# Patient Record
Sex: Male | Born: 1945 | Race: White | Hispanic: No | Marital: Married
Health system: Midwestern US, Community
[De-identification: ages and names within clinical notes are randomized; demographics above are authoritative.]

## PROBLEM LIST (undated history)

## (undated) DIAGNOSIS — M75111 Incomplete rotator cuff tear or rupture of right shoulder, not specified as traumatic: Secondary | ICD-10-CM

---

## 2006-09-28 ENCOUNTER — Encounter: Admission: RE | Admit: 2006-09-28 | Discharge: 2006-09-28 | Payer: Self-pay | Admitting: Family Medicine

## 2008-05-19 ENCOUNTER — Inpatient Hospital Stay (HOSPITAL_COMMUNITY): Admission: RE | Admit: 2008-05-19 | Discharge: 2008-05-22 | Payer: Self-pay | Admitting: Orthopedic Surgery

## 2008-10-13 ENCOUNTER — Ambulatory Visit (HOSPITAL_BASED_OUTPATIENT_CLINIC_OR_DEPARTMENT_OTHER): Admission: RE | Admit: 2008-10-13 | Discharge: 2008-10-13 | Payer: Self-pay | Admitting: Orthopedic Surgery

## 2009-03-24 ENCOUNTER — Encounter: Admission: RE | Admit: 2009-03-24 | Discharge: 2009-03-24 | Payer: Self-pay | Admitting: Family Medicine

## 2009-09-25 IMAGING — CR DG CHEST 2V
2 series · 2 of 2 positions shown · non-contrast
Comparison: None

CLINICAL DATA: Preoperative respiratory exam for hip replacement.
Hypertension.

CHEST - 2 VIEW

[view not recorded (1 of 2)]
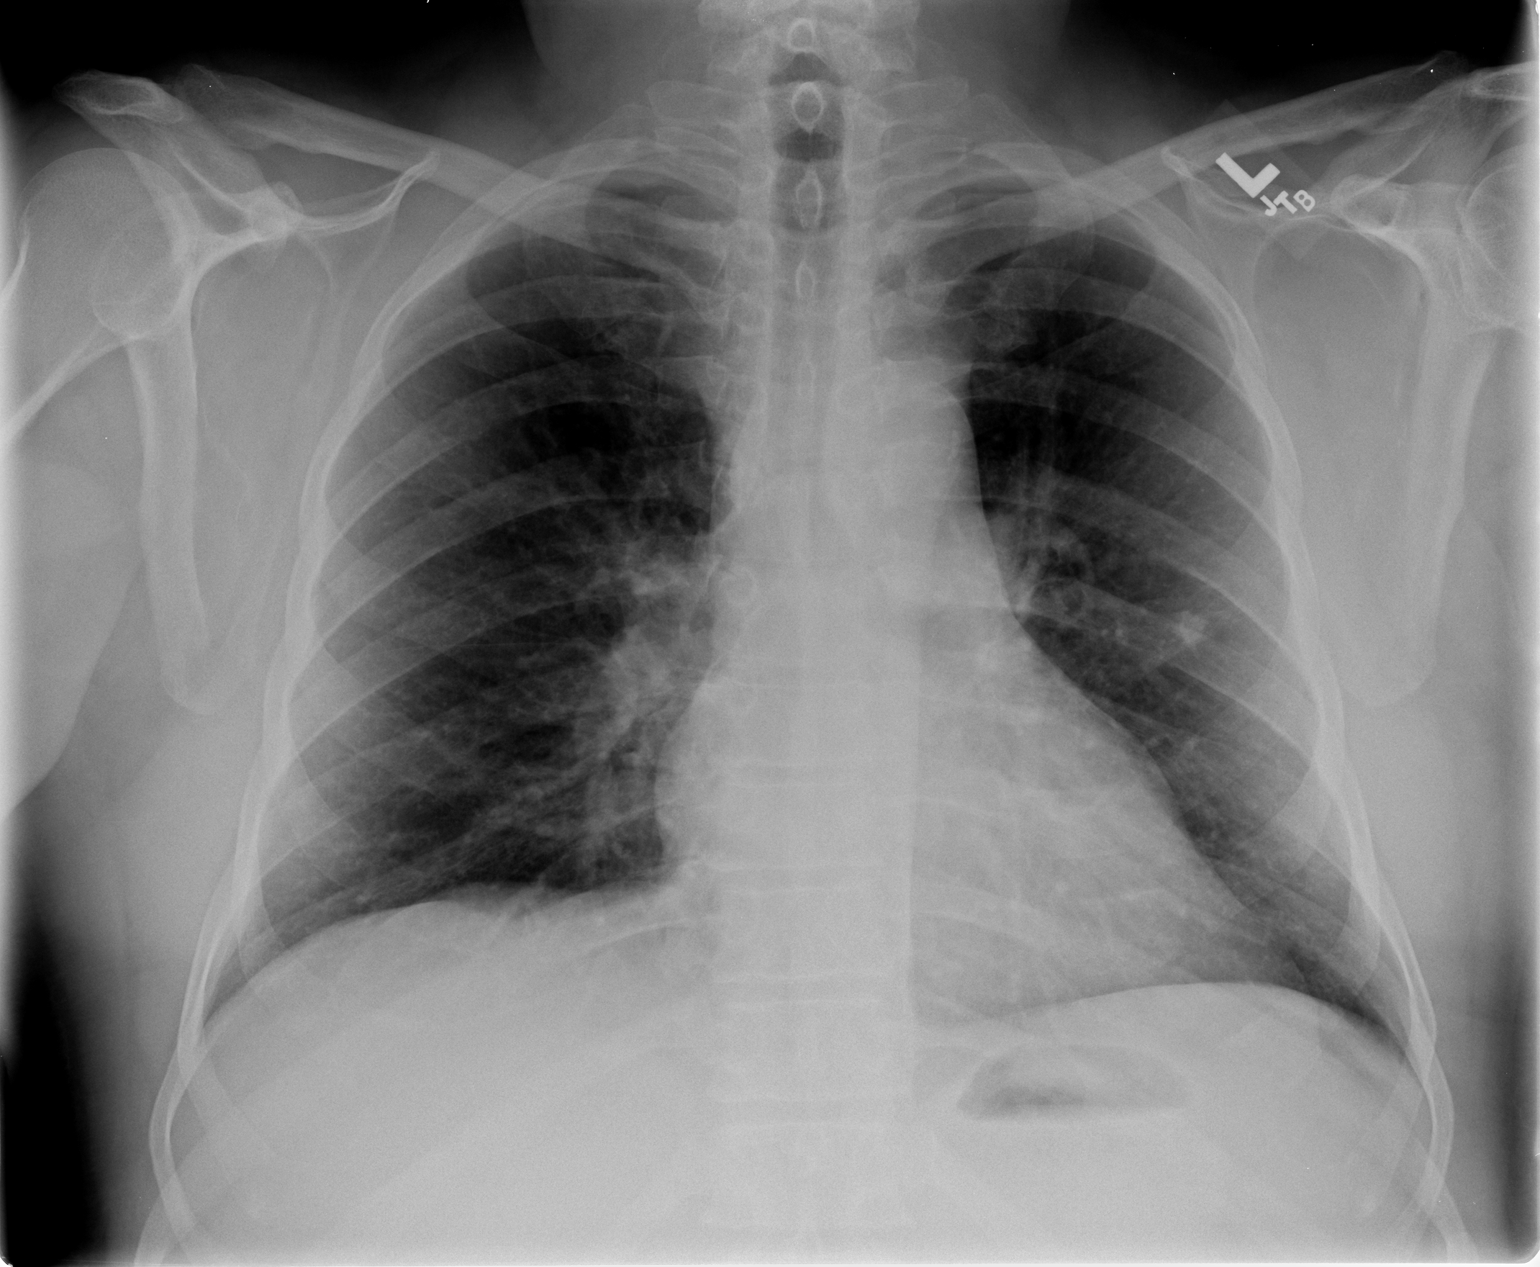

[view not recorded (2 of 2)]
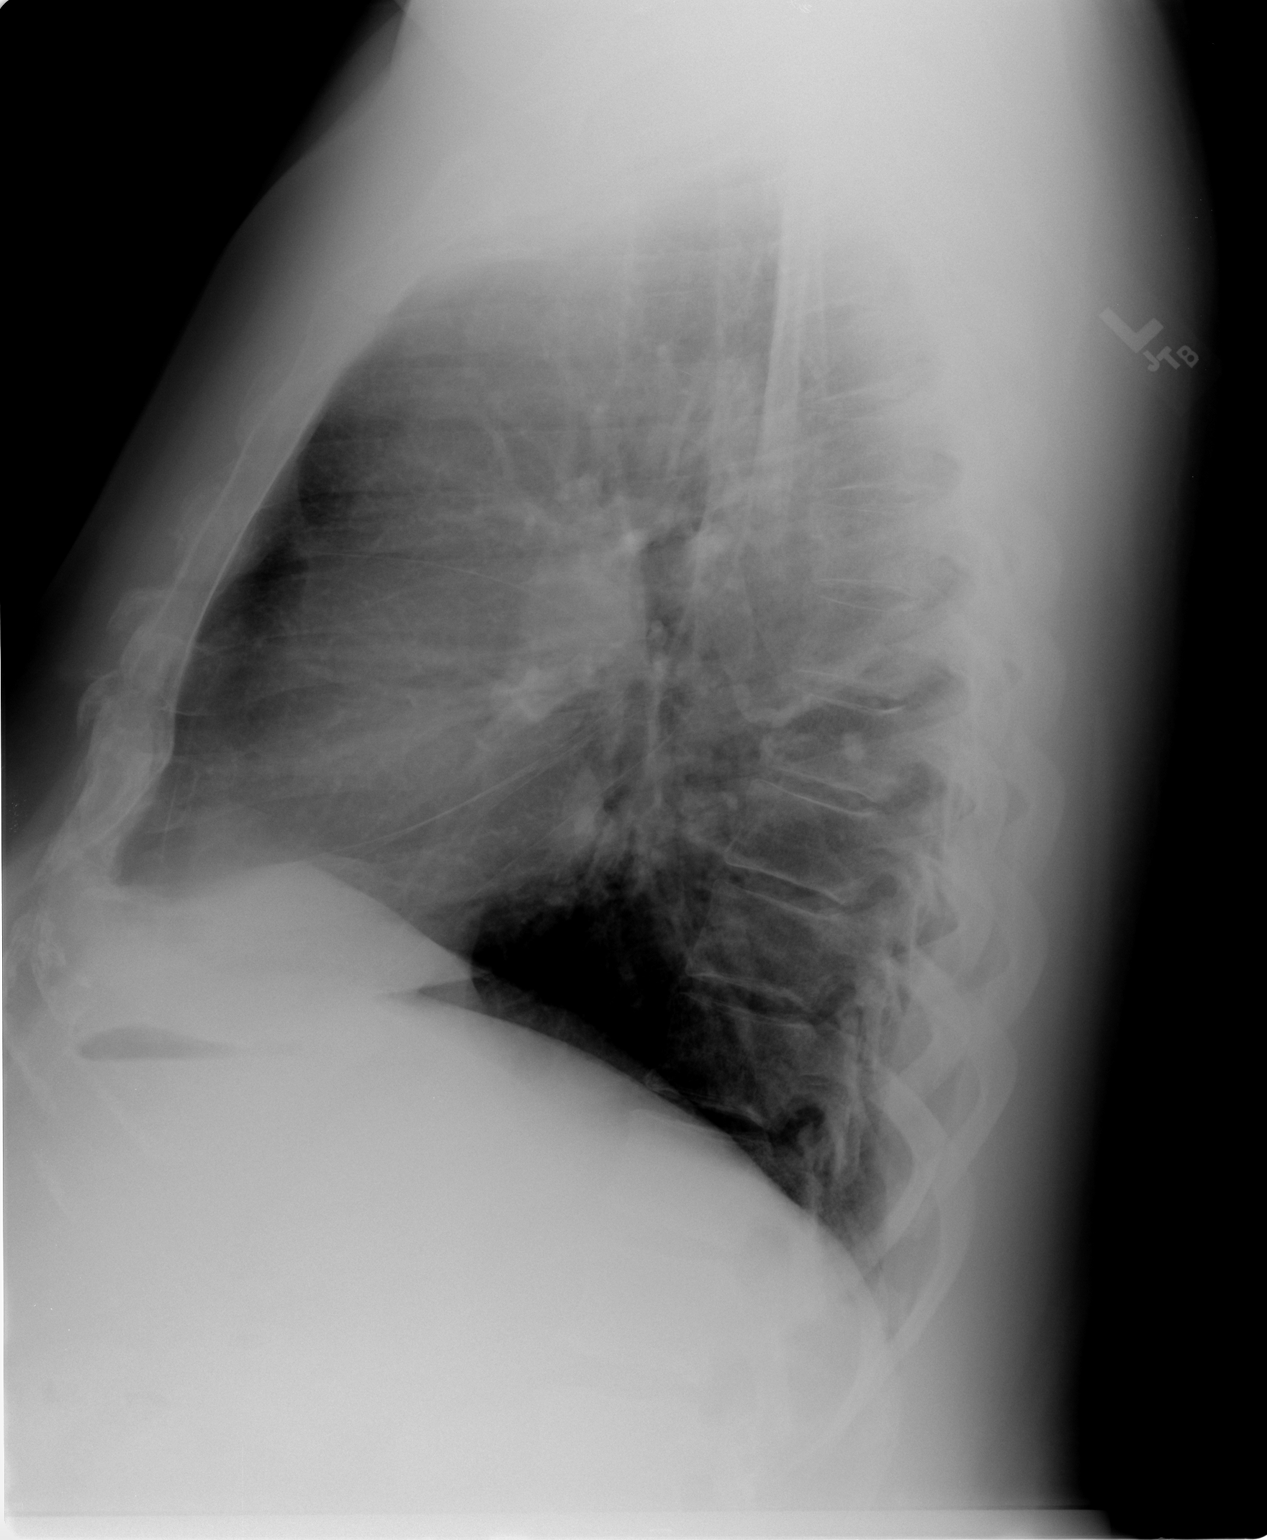

[2 of 2 positions shown; findings below may reference images not displayed]

FINDINGS: Heart size is normal.  Mediastinum is unremarkable.
There is a calcified granuloma in the superior segment of the left
lower lobe.  There are a few calcified hilar and mediastinal nodes.
No evidence of active infiltrate, mass, effusion or collapse.  Bony
structures unremarkable.
IMPRESSION: No active disease.  Old granulomatous infection.

## 2010-08-24 LAB — CBC W/O DIFF
HCT: 44.7 % (ref 41.1–50.3)
HGB: 15.4 g/dL (ref 13.2–17.1)
MCH: 29.7 PG (ref 26.1–32.9)
MCHC: 34.5 g/dL (ref 31.4–35.0)
MCV: 86.1 FL (ref 79.6–97.8)
MPV: 10.6 FL — ABNORMAL LOW (ref 10.8–14.1)
PLATELET: 190 10*3/uL (ref 150–450)
RBC: 5.19 M/uL (ref 4.23–5.67)
RDW: 13 % (ref 11.9–14.6)
WBC: 9 10*3/uL (ref 4.3–11.1)

## 2010-08-24 LAB — METABOLIC PANEL, BASIC
Anion gap: 9 mmol/L (ref 7–16)
BUN: 15 MG/DL (ref 8–23)
CO2: 30 MMOL/L (ref 23–32)
Calcium: 8.5 MG/DL (ref 8.3–10.4)
Chloride: 105 MMOL/L (ref 98–107)
Creatinine: 1.3 MG/DL (ref 0.8–1.5)
GFR est AA: >60 mL/min/{1.73_m2} (ref >60)
GFR est non-AA: 59 mL/min/{1.73_m2} — ABNORMAL LOW (ref >60)
Glucose: 129 MG/DL — ABNORMAL HIGH (ref 65–100)
Potassium: 3.8 MMOL/L (ref 3.5–5.1)
Sodium: 144 MMOL/L (ref 136–145)

## 2010-08-24 LAB — PTT: aPTT: 26.6 s (ref 25.3–32.9)

## 2010-08-24 LAB — URINALYSIS W/O MICRO
Bilirubin: NEGATIVE
Blood: NEGATIVE
Glucose: NEGATIVE MG/DL
Ketone: NEGATIVE MG/DL
Leukocyte Esterase: NEGATIVE
Nitrites: NEGATIVE
Protein: NEGATIVE MG/DL
Specific gravity: >1.03 — ABNORMAL HIGH (ref 1.001–1.023)
Urobilinogen: 0.2 EU/DL (ref 0.2–1.0)
pH (UA): 5.5 (ref 5.0–9.0)

## 2010-08-24 LAB — PROTHROMBIN TIME + INR
INR: 1 (ref 0.9–1.2)
Prothrombin time: 10.3 s (ref 8.6–12.2)

## 2010-08-24 NOTE — Other (Signed)
Pre-assessment visit completed. Name, DOB, and procedure verified with patient.  Patient verbalizes understanding of instructions in Guide to Surgery handout given.  Patient is comfortable with anesthesia evaluation on DOS and declines a visit with anesthesia today.     Patient instructed to remain NPO after midnight, and understands instructions given regarding which medications to take DOS with a small sip of water.   Patient instructed on where to arrive DOS (3rd floor) and understands preop instructions including use of Hibiclens skin cleanser. Nasal swab and bactroban teaching complete.    All ordered labs completed. Results within anesthesia guidelines. No EKG required per protocol.

## 2010-08-24 NOTE — Progress Notes (Signed)
[facility]           [address] [place], [place] [address]  [phone]    Prehab Plan of Treatment and Evaluation Summary     Consult Date: 08/24/2010  Referring Physician: Claris Gower, MD  Medical Diagnosis: OA  Treatment Diagnosis: Left hip pain and weakness in joint, pelvic region and thigh  Phone Number: [phone] (home)   Date of Surgery: 09/21/10  Home Situation:   Home Situation  Home Environment: Private residence  # Steps to Enter: 5   Rails to Enter: No  One/Two Story Residence: One Technical sales engineer Systems: Spouse   Patient Expects to be Discharged to:: Private residence  Current DME Used/Available at Home: Environmental consultant, rolling;Cane, straight;Commode, bedside  Tub or Shower Type: Shower  Functional limitations prior to injury or illness:    Bathing/Showering:   [x]  (0) No Problems with this activity  []  (1) Pain level increases with this activity  []  (1) Assistive Device with this activity  []  (1) Extra Time needed with this activity  []  (1) Assistance from another person  []  (5) Only able to do sponge bath Ambulation:  []  (0) No Problems with this activity  []  (1) Assistive Device with this activity  [x]  (1) Unable to go up and down stairs  []  (1) Able to walk short distances only  []  (1) Hold onto Furniture/countertops  []  (1) Assistance from another person  []  (5) Transfers Only/No walking     Dressing:  []  (0) No Problems with this activity  []  (1) Pain level increases with this activity  []  (1) Assistive Device with this activity  [x]  (1) Extra Time needed with this activity  [x]  (1) Assistance from another person  []  (5) Someone else dresses my lower body Household Activities:  []  (1) Routine house and yard work  [x]  (2) All but heavy work   []  (3) Housework only  []  (4) Light housework   []  (5) None     Additional Comments: motivated  Pain Screen  Pain Scale 1: Numeric (0 - 10)  Pain Intensity 1: 9  Pain Location 1: Hip   Pain Orientation 1: Left;Lateral;Anterior  Pain Description 1: Radiating;Intermittent  Recent Pain History (0=no pain, 10=worst)  Least pain (over the previous 24 Hours): 7  Most pain (over the previous 24 Hours): 9  Statement of patient's goals: "fix the hip"  OBJECTIVE:  Gross Assessment  AROM: Within functional limits (R LE)  Strength: Within functional limits (R LE)  Sensation: Intact (R LE)   Distance (ft): 1000 Feet (ft)Ambulation - Level of Assistance: Completely independent      Gait Abnormalities: Antalgic  Surface:level tile                      LLE AROM  L Hip Flexion: 120   L Hip ABduction: 30    LLE Strength  L Hip Flexion: 4-  L Hip ABduction: 4-  L Knee Extension: 4    Prehab Problem: Decreased strength and increased pain in left lower extremity(s).  Prehab Goal:  Pt. will increase strength via home exercise program to minimize functional deficits in regards to upcoming surgery using education, demonstration and by providing an informational handout including a home exercise program.  Prehab Plan:  This is a one time physical therapy visit and the patient will be discharged from physical therapy after this visit.  Time Frame: Two to three weeks  Treatment Plan  Effective Dates: 08/24/2010 to 08/24/2010  Rehabilitation potential for stated goals: Good    PT Patient Time In/Time Out  Time In: 1200  Time Out: 1245  DENISE LEE FLASPOEHLER, PT

## 2010-08-25 LAB — MSSA/MRSA SC BY PCR, NASAL SWAB

## 2010-08-25 NOTE — Other (Signed)
Patient notified of negative MSSA/MRSA nasal swab. Verbalized understanding.

## 2010-09-10 NOTE — Other (Signed)
Opened chart to perform audit

## 2010-09-20 NOTE — Other (Signed)
Open chart to review prior to surgery to ensure patient data is complete.

## 2010-09-20 NOTE — H&P (Signed)
[facility]  History and Physical Exam    Patient ID:  Brian Buckley  [id number]    64 y.o.  [birth year]    Today: September 20, 2010    Vitals Signs: Reviewed as noted in medical record.    Allergies: No Known Allergies    CC: Revision left Hip.    HPI:  Pt complains of left hip pain and with difficulty ambulating. Pain with rest at night. Difficulty getting to socks and shoes.     Relevant PMH: Past Medical History   Diagnosis Date   ??? Hypertension    ??? Arthritis    ??? Nausea & vomiting    ??? Unspecified sleep apnea      does not use CPAP         Objective:                    HEENT: NC/AT                   Lungs:  clear                   Heart:   rrr                   Abdomen: soft                   Extremities:  Pain with rom of the left hip      Assessment:  Complication of the left hip replacement.    Plan:  Proceed with scheduled revision of the left hip arthroplasty    Signed By: Claris Gower, MD  September 20, 2010

## 2010-09-21 ENCOUNTER — Inpatient Hospital Stay
Admit: 2010-09-21 | Discharge: 2010-09-23 | Disposition: A | Discharge status: Home Health | Source: Ambulatory Visit | Attending: Orthopaedic Surgery | Admitting: Orthopaedic Surgery

## 2010-09-21 DIAGNOSIS — Z96649 Presence of unspecified artificial hip joint: Secondary | ICD-10-CM

## 2010-09-21 LAB — TYPE & SCREEN
ABO/Rh(D): A NEG
Antibody screen: NEGATIVE

## 2010-09-21 LAB — GLUCOSE, POC: Glucose (POC): 102 mg/dL — ABNORMAL HIGH (ref 65–100)

## 2010-09-21 LAB — HEMOGLOBIN: HGB: 13.7 g/dL (ref 13.2–17.1)

## 2010-09-21 LAB — TYPE AND SCREEN
ABO/Rh: A NEG
Antibody Screen: NEGATIVE

## 2010-09-21 MED ORDER — SALINE PERIPHERAL FLUSH Q8H
Freq: Three times a day (TID) | INTRAMUSCULAR | Status: DC
Start: 2010-09-21 — End: 2010-09-23
  Administered 2010-09-21 – 2010-09-23 (×7)

## 2010-09-21 MED ORDER — SALINE PERIPHERAL FLUSH PRN
INTRAMUSCULAR | Status: DC | PRN
Start: 2010-09-21 — End: 2010-09-23

## 2010-09-21 MED ORDER — LACTATED RINGERS IV
INTRAVENOUS | Status: DC
Start: 2010-09-21 — End: 2010-09-21

## 2010-09-21 MED ADMIN — docusate sodium (COLACE) capsule 100 mg: ORAL | @ 23:00:00 | NDC 62584068311

## 2010-09-21 MED ADMIN — ceFAZolin (ANCEF) 2g IVPB: INTRAVENOUS | @ 17:00:00 | NDC 99990004319

## 2010-09-21 MED ADMIN — famotidine (PEPCID) tablet 20 mg: ORAL | @ 15:00:00 | NDC 68084017211

## 2010-09-21 MED ADMIN — celecoxib (CELEBREX) capsule 200 mg: ORAL | @ 23:00:00 | NDC 00025152551

## 2010-09-21 MED ADMIN — lidocaine (XYLOCAINE) 10 mg/mL (1 %) injection 0.1 mL: SUBCUTANEOUS | @ 15:00:00 | NDC 00409427601

## 2010-09-21 MED ADMIN — metoprolol-XL (TOPROL-XL) tablet 100 mg: ORAL | @ 16:00:00 | NDC 00186109239

## 2010-09-21 MED ADMIN — oxyCODONE (ROXICODONE) tablet 10 mg: ORAL | @ 21:00:00 | NDC 00406055262

## 2010-09-21 MED ADMIN — ondansetron (ZOFRAN ODT) tablet 8 mg: ORAL | @ 21:00:00 | NDC 71930001852

## 2010-09-21 MED ADMIN — lactated ringers infusion: INTRAVENOUS | @ 15:00:00 | NDC 00409795309

## 2010-09-21 MED ADMIN — metoclopramide (REGLAN) injection 10 mg: INTRAVENOUS | NDC 00703450204

## 2010-09-21 MED FILL — FENTANYL CITRATE (PF) 50 MCG/ML IJ SOLN: 50 mcg/mL | INTRAMUSCULAR | Qty: 5

## 2010-09-21 MED FILL — CELEBREX 200 MG CAPSULE: 200 mg | ORAL | Qty: 1

## 2010-09-21 MED FILL — NEOMYCIN-POLYMYXIN B GU 40 MG-200,000 UNIT/ML IRRIGATION SOLN: 40 mg-200,000 unit/mL | Qty: 3

## 2010-09-21 MED FILL — KETOROLAC TROMETHAMINE 30 MG/ML INJECTION: 30 mg/mL (1 mL) | INTRAMUSCULAR | Qty: 1

## 2010-09-21 MED FILL — OXYCODONE 5 MG TAB: 5 mg | ORAL | Qty: 2

## 2010-09-21 MED FILL — METOPROLOL SUCCINATE SR 100 MG 24 HR TAB: 100 mg | ORAL | Qty: 1

## 2010-09-21 MED FILL — DEXTROSE 5%-1/2 NORMAL SALINE IV: INTRAVENOUS | Qty: 1000

## 2010-09-21 MED FILL — DOCUSATE SODIUM 100 MG CAP: 100 mg | ORAL | Qty: 1

## 2010-09-21 MED FILL — MORPHINE 10 MG/ML IJ SOLN: 10 mg/mL | INTRAMUSCULAR | Qty: 1

## 2010-09-21 MED FILL — EPINEPHRINE (PF) 1 MG/ML INJECTION: 1 mg/mL ( mL) | INTRAMUSCULAR | Qty: 1

## 2010-09-21 MED FILL — MIDAZOLAM 5 MG/ML IJ SOLN: 5 mg/mL | INTRAMUSCULAR | Qty: 1

## 2010-09-21 MED FILL — NAROPIN (PF) 2 MG/ML (0.2 %) INJECTION SOLUTION: 2 mg/mL (0. %) | INTRAMUSCULAR | Qty: 60

## 2010-09-21 MED FILL — METOCLOPRAMIDE 5 MG/ML IJ SOLN: 5 mg/mL | INTRAMUSCULAR | Qty: 2

## 2010-09-21 NOTE — Progress Notes (Signed)
BP 148/83   Pulse 64   Temp 97.7 ??F (36.5 ??C)   Resp 16   Ht 5' 10.5" (1.791 m)   Wt 263 lb (119.296 kg)   BMI 37.20 kg/m2   SpO2 99%.  Alert and oriented. Satisfactory pain control.  Tolerated procedure well without apparent problems.  Transfer to floor.  Follow up with surgeon.

## 2010-09-21 NOTE — Progress Notes (Signed)
Pt left on O2 has OSA but does not wear CPAP.

## 2010-09-21 NOTE — Other (Signed)
TRANSFER - OUT REPORT:    Verbal report given to [facility] RN on Brian Buckley  being transferred to [facility] for routine progression of care       Report consisted of patient???s Situation, Background, Assessment and   Recommendations(SBAR).     Information from the following report(s) SBAR, Kardex, [facility] and Recent Results was reviewed with the receiving nurse.    Opportunity for questions and clarification was provided.      Dr. Beryle Quant was called to report pt not taking beta blocker this am, he ordered Toprol XL 100mg  once.  Shelly notified and medication will be administered.  Shelly also notified that pt needs to sign paperwork from pt relations before any sedation.

## 2010-09-21 NOTE — Progress Notes (Signed)
Pt resting well up in bed tolerating dinner well.  No c/os and no changes in assessment.  Pt wiggles toes well bilat and has strong push/ pulls.  Wife at bedside.

## 2010-09-21 NOTE — Progress Notes (Signed)
Pt lying semi fowlers position in bed.Left hip dressing intact with large amount of sanguinous breakthrough drainage.Reinforced bandage with 4x4's, ABD pads and secured with tape. Neurovascular status WDL. Denies further needs at present. Instructed to call for any needs and for assistance before getting out of bed.  Side rails up x3. Call light within reach.Bed in low and locked position. Family member at bedside.

## 2010-09-21 NOTE — Progress Notes (Signed)
Problem: Mobility Impaired (Adult and Pediatric)  Goal: *Acute Goals and Plan of Care (Insert Text)  SHORT TERM GOALS:  (1.)Brian Buckley will move from supine to sit and sit to supine in bed with CONTACT GUARD ASSIST within 1-3 days.   (2.)Brian Buckley will transfer from bed to chair and chair to bed with CONTACT GUARD ASSIST using the least restrictive device within 1-3 days.   (3.)Brian Buckley will ambulate with CONTACT GUARD ASSIST for 150 feet with the least restrictive device within 1-3 days.     LONG TERM GOALS:  (1.)Brian Buckley will move from supine to sit and sit to supine in bed with SUPERVISION within 4-6 days.   (2.)Brian Buckley will transfer from bed to chair and chair to bed with SUPERVISION using the least restrictive device within 4-6 days.   (3.)Brian Buckley will ambulate with SUPERVISION for 300 feet with the least restrictive device within 4-6 days.   (4.)Brian Buckley will ambulate up/down 3 steps with bilateral railing with STAND BY ASSIST with no device within 4-6 days.  (5.)Brian Buckley will state/observe THA precautions with 0 verbal cues within 4-6 days.  ________________________________________________________________________________________________    ACUTE PHYSICAL THERAPY JOINT CAMP THA ASSESSMENT NOTE  [X]    Initial/Completed Assessment                [ ]    7th Visit        [ ]    Discharge  NAME/AGE/GENDER: Brian Buckley is a 64 y.o. 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 Hypertension     ??? Arthritis     ??? Nausea & vomiting     ??? Unspecified sleep apnea         does not use CPAP      Past Surgical History   Procedure Date   ??? Total hip arthroplasty 1995, 2009       right, left   ??? Hx orthopaedic         right big toe fusion   ??? Hx other surgical 1965       pilonial cyst       Patient Active Problem List   Diagnoses Code   ??? 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  OBJECTIVE:    Pain Intensity 1: 6Pain Location 1: Hip    Gross Assessment: Yes  Gross Assessment  AROM: Within functional limits (except L LE, s/p THA)  Strength: Within functional limits (except L LE, s/p THA)    Bed Mobility  Supine to Sit: Additional time;Assist X1;Minimal assistance;CGA  Sit to Supine: Assist X1;Additional time;Minimum assistance;CGA  Scooting: Additional time    Transfers  Sit to Stand: Assist X2;Minimum assistance;Verbal cues  Stand to Sit: Assist X2;Minimum assistance;Verbal cues    Balance  Sitting: Intact  Standing: Intact;With support       Posture  Posture (WDL): Within defined limits    Left Side Weight Bearing: As tolerated  Gait Training  Assistive Device: Walker, rolling  Ambulation - Level of Assistance: Minimal assistance (of 1 to 2)  Distance (ft): 4 Feet (ft)  Speed/Cadence: Delayed  Step Length: Left shortened;Right shortened  Stance: Left decreased  Gait Abnormalities: Antalgic;Step to gait   Surface:level tile     Braces/Orthotics: none      Treatment Times:              Initial Evaluation: 20 minutes              Therapeutic Exercise:               Gait Training:               Therapeutic Activity:                Neuromuscular Re-education:      Education:  [ ]    Home Exercises    [ ]    Going Home Video             [X]    Hip Precautions    [ ]    Fall Precautions     [ ]    Knee/Hip Prosthesis Review  [X]    Walker Management/Safety   [ ]    Other:     Safety:    After treatment precautions: [X]    Bed            [ ]    Rails Up        [ ]    Chair     [X]    Essentials within Reach    [ ]    Restraint in place      [X]    Caregiver present  [ ]    RN notified              [ ]    Bed Alarm/Tab Alert applied  ASSESSMENT:  Patient would benefit from skilled Physical Therapy intervention to maximize independence with functional mobility.   PROBLEM LIST:  1. Decreased Independence with Bed Mobility   2. Decreased Independence with Transfers   3. Decreased Independence with Ambulation   4. Decreased Independence with Stair Climbing   5. Decreased Hip ROM and Strength     INTERVENTIONS PLANNED:  1. Bed Mobility Training   2. Transfer Training   3. Gait Training   4. Stair Training   5. Therapeutic Exercises   6. Modalities for Pain     PLAN: Continue to follow patient twice daily for  until goals met to address goals per initial evaluation.    REHABILITATION POTENTIAL FOR STATED GOALS:  Good             ?? Patient???s response to todays session was: tolerated well with no complications.   ?? Compliance with program/exercises: compliant all of the time.   ?? Recommended level of rehabilitation at time of discharge (pending progress): Home Health   ?? Other Comments/Recommendations/DME: has a RW, cand and a BSC    Plan/Intent for next treatment: Physical therapy for bed mobility, transfers, gait training, strength/ROM exercises, modalities for pain, and patient education.   PT Patient Time In/Time Out  Time In: 1550  Time Out: 1611  MICHELLE N HOLMES, PT

## 2010-09-21 NOTE — Progress Notes (Signed)
TRANSFER - IN REPORT:    Verbal report received from Carvel Getting, rn(name) on Thai Burgueno  being received from [employer]) for routine progression of care      Report consisted of patient???s Situation, Background, Assessment and   Recommendations(SBAR).     Information from the following report(s) Kardex, Procedure Summary, Intake/Output, MAR and Recent Results was reviewed with the receiving nurse.    Opportunity for questions and clarification was provided.      Assessment completed upon patient???s arrival to unit and care assumed.     Pt and family oriented to room, bed controls and nursing call light.  Handout given to pt r/t Lovenox and pain meds while in hospital.  Encouraged i/s and po fluids.    2

## 2010-09-21 NOTE — Other (Signed)
Betadine lavage:  17.5cc of betadine lot # [id number] , exp. Date 04/2013 ,  in 500cc of .9NS Lot # [id number] , exp. Date :24 Mar 2013

## 2010-09-21 NOTE — Other (Signed)
TRANSFER - OUT REPORT:    Verbal report given to  [facility] RN  on Brian Buckley  being transferred to   [facility]   for routine progression of care       Report consisted of patient???s Situation, Background, Assessment and   Recommendations(SBAR).     Information from the following report(s) SBAR, OR Summary, Procedure Summary, Intake/Output and MAR was reviewed with the receiving nurse.    Opportunity for questions and clarification was provided.

## 2010-09-21 NOTE — Brief Op Note (Signed)
BRIEF OPERATIVE NOTE    Date of Procedure: 09/21/2010   Preoperative Diagnosis: OA  Postoperative Diagnosis: OA    Procedure:  HIP ARTHROPLASTY TOTAL REVISION - REMOVAL DEPUY  IMPLANT /  REVISION TOTAL  HIP WITH DEPUY    Surgeon: Claris Gower, MD  Assistant(s): Gwen Pounds and Electa Sniff   Anesthesia: Spinal   Estimated Blood Loss: 400  Specimens: * No specimens in log *   Findings: See full operative note.  Complications: none  Implants:   Implant Name Type Inv. Item Serial No. Manufacturer Lot No. LRB No. Used Action   SCR BNE CANC PINN 6.5X30MM SS - [id number]  SCR BNE CANC PINN 6.5X30MM SS [id number] [employer] [id number] Left 1 Implanted   SCR ACET CANC PINN 6.5X25MM SS - [id number]  SCR ACET CANC PINN 6.5X25MM SS [id number] [employer] [id number] Left 1 Implanted   CUP ACET MH PINN GRIPTION - [id number]  CUP ACET MH PINN GRIPTION [id number] [employer] [id number] Left 1 Implanted   S-ROM M-SPEC FEMORAL HEAD   [id number] [employer] [id number] Left 1 Implanted   ACETABULAR LINER     [id number] [employer] [id number] Left 1 Implanted

## 2010-09-22 LAB — HEMOGLOBIN: HGB: 12 g/dL — ABNORMAL LOW (ref 13.2–17.1)

## 2010-09-22 MED ADMIN — ondansetron (ZOFRAN ODT) tablet 8 mg: ORAL | @ 02:00:00 | NDC 00781523906

## 2010-09-22 MED ADMIN — oxyCODONE (ROXICODONE) tablet 10 mg: ORAL | @ 14:00:00 | NDC 00406055262

## 2010-09-22 MED ADMIN — oxyCODONE (ROXICODONE) tablet 10 mg: ORAL | @ 09:00:00 | NDC 00406055262

## 2010-09-22 MED ADMIN — ondansetron (ZOFRAN ODT) tablet 8 mg: ORAL | @ 19:00:00 | NDC 71930001852

## 2010-09-22 MED ADMIN — ceFAZolin (ANCEF) 2g IVPB: INTRAVENOUS | @ 17:00:00 | NDC 99990004319

## 2010-09-22 MED ADMIN — tuberculin injection 5 Units: INTRADERMAL | @ 14:00:00 | NDC 99990003564

## 2010-09-22 MED ADMIN — dextrose 5 % - 0.45% NaCl infusion: INTRAVENOUS | @ 02:00:00 | NDC 00409792609

## 2010-09-22 MED ADMIN — oxyCODONE (ROXICODONE) tablet 10 mg: ORAL | @ 01:00:00 | NDC 00406055262

## 2010-09-22 MED ADMIN — ceFAZolin (ANCEF) 2g IVPB: INTRAVENOUS | @ 09:00:00 | NDC 99990004319

## 2010-09-22 MED ADMIN — celecoxib (CELEBREX) capsule 200 mg: ORAL | @ 22:00:00 | NDC 00025152551

## 2010-09-22 MED ADMIN — dextrose 5 % - 0.45% NaCl infusion: INTRAVENOUS | @ 22:00:00 | NDC 00409792609

## 2010-09-22 MED ADMIN — aspirin delayed-release tablet 81 mg: ORAL | @ 22:00:00 | NDC 00904770418

## 2010-09-22 MED ADMIN — ceFAZolin (ANCEF) 2g IVPB: INTRAVENOUS | @ 01:00:00 | NDC 99990004319

## 2010-09-22 MED ADMIN — docusate sodium (COLACE) capsule 100 mg: ORAL | @ 14:00:00 | NDC 62584068311

## 2010-09-22 MED ADMIN — docusate sodium (COLACE) capsule 100 mg: ORAL | @ 22:00:00 | NDC 62584068311

## 2010-09-22 MED ADMIN — enoxaparin (LOVENOX) injection 40 mg: SUBCUTANEOUS | @ 14:00:00 | NDC 00075062040

## 2010-09-22 MED ADMIN — aspirin delayed-release tablet 81 mg: ORAL | @ 14:00:00 | NDC 00904770418

## 2010-09-22 MED ADMIN — celecoxib (CELEBREX) capsule 200 mg: ORAL | @ 14:00:00 | NDC 00025152551

## 2010-09-22 MED ADMIN — ondansetron (ZOFRAN ODT) tablet 8 mg: ORAL | @ 10:00:00 | NDC 00781523906

## 2010-09-22 MED ADMIN — metoprolol-XL (TOPROL-XL) tablet 100 mg: ORAL | @ 18:00:00 | NDC 58177036809

## 2010-09-22 MED ADMIN — metoprolol-XL (TOPROL-XL) tablet 100 mg: ORAL | @ 17:00:00 | NDC 00186109239

## 2010-09-22 MED ADMIN — oxyCODONE (ROXICODONE) tablet 10 mg: ORAL | @ 17:00:00 | NDC 00406055262

## 2010-09-22 MED ADMIN — aspirin delayed-release tablet 81 mg: ORAL | @ 02:00:00 | NDC 00904770418

## 2010-09-22 MED ADMIN — oxyCODONE (ROXICODONE) tablet 10 mg: ORAL | NDC 00406055262

## 2010-09-22 MED FILL — OXYCODONE 5 MG TAB: 5 mg | ORAL | Qty: 2

## 2010-09-22 MED FILL — CELEBREX 200 MG CAPSULE: 200 mg | ORAL | Qty: 1

## 2010-09-22 MED FILL — METOPROLOL SUCCINATE SR 100 MG 24 HR TAB: 100 mg | ORAL | Qty: 1

## 2010-09-22 MED FILL — LOVENOX 40 MG/0.4 ML SUBCUTANEOUS SYRINGE: 40 mg/0.4 mL | SUBCUTANEOUS | Qty: 0.4

## 2010-09-22 MED FILL — ASPIRIN 81 MG TAB, DELAYED RELEASE: 81 mg | ORAL | Qty: 1

## 2010-09-22 MED FILL — CEFAZOLIN 2 G IN 100 ML 0.9% NS: INTRAVENOUS | Qty: 100

## 2010-09-22 MED FILL — TUBERCULIN PPD 5 UNIT/0.1 ML INTRADERMAL: 5 tub. unit /0.1 mL | INTRADERMAL | Qty: 0.1

## 2010-09-22 MED FILL — DOCUSATE SODIUM 100 MG CAP: 100 mg | ORAL | Qty: 1

## 2010-09-22 MED FILL — ONDANSETRON 8 MG TAB, RAPID DISSOLVE: 8 mg | ORAL | Qty: 1

## 2010-09-22 MED FILL — MARCAINE SPINAL (PF) 0.75 % (7.5 MG/ML) INJECTION SOLUTION: 0.75 % (7.5 mg/mL) | INTRAMUSCULAR | Qty: 2

## 2010-09-22 NOTE — Progress Notes (Signed)
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  OBJECTIVE:  Pt. Recalled  2/3 hip precautions.  Pain Intensity 1: 4 (during gait; 0/10 at rest)Pain Location 1: HipPain Orientation 1: LeftPain Intervention(s) 1: Medication (see  PTA Meds/MAR)              Gait Training  Assistive Device: Walker, rolling  Ambulation - Level of Assistance: CGA  Distance (ft): 215 Feet (ft)  Speed/Cadence: Delayed  Step Length: Left shortened;Right shortened  Stance: Left decreased  Gait Abnormalities: Antalgic    Bed Mobility  Supine to Sit: Minimal assistance  Sit to Supine:  (pt left up in chair)  Scooting: Additional time     Transfers  Sit to Stand: Minimum assistance  Stand to Sit: Minimum assistance     Balance  Sitting: Intact  Standing: Pull to stand;With support                     Left Hip Cold  Type: Cold/ice pack  Patient Position: Sitting      EXERCISES   AM    PM   Active Active Assist   Passive   Comments   GROUP THERAPY []  []        Ankle Pumps  15 [x]  []  []     Quad Sets  15 [x]  []  []     Gluteal Sets  15 [x]  []  []     Hip Abd/Adduction  15 [x]  []  []     Straight Leg Raises   []  []  []     Heel Slides  15 []  [x]  []     Short Arc Quads   []  []  []     Long Arc Quads  15 [x]  []  []     Chair Slides   []  []  []        []  []  []       Treatment Times:   Therapeutic Exercise: 10 Minutes   Gait Training:  15 minutes Therapeutic Activity:      Neuromuscular Re-education:    Education:  [x] Home Exercises  [] Going Home Video   [x] Hip Precautions   [] Fall Precautions  [] Knee/Hip Prosthesis Review  [x] Walker Management/Safety   [] Other:   Safety:    After treatment precautions: [] Bed            [] Rails Up        [x] Chair      [x] Essentials within Reach    [] Restraint in place      [x] Caregiver present  [] RN notified   [] Bed Alarm/Tab Alert applied    ASSESSMENT:    ?? Patient???s response to today???s session was: tolerated well with no complications.  ?? Patient is demonstrating: moderate progress towards goal(s).  ?? Compliance with program/exercises: compliant most of the time.   ?? Recommended level of rehabilitation at time of discharge (pending progress): Home Health  ?? Other Comments/Recommendations/DME: rolling walker      Plan/Intent for next treatment: Physical therapy for bed mobility, transfers, gait training, strength/ROM exercises, modalities for pain, and patient education.    Continue to follow patient twice daily for  duration of hospital stay to address goals per initial evaluation.  PT Patient Time In/Time Out  Time In: 1330  Time Out: 1400  JULIE K MCCORMICK, PT

## 2010-09-22 NOTE — Progress Notes (Signed)
Problem: Mobility Impaired (Adult and Pediatric)  Goal: *Acute Goals and Plan of Care (Insert Text)  SHORT TERM GOALS:  (1.)Brian Buckley will move from supine to sit and sit to supine in bed with CONTACT GUARD ASSIST within 1-3 days.   (2.)Brian Buckley will transfer from bed to chair and chair to bed with CONTACT GUARD ASSIST using the least restrictive device within 1-3 days.   (3.)Brian Buckley will ambulate with CONTACT GUARD ASSIST for 150 feet with the least restrictive device within 1-3 days. GOAL MET 09/22/2010    LONG TERM GOALS:  (1.)Brian Buckley will move from supine to sit and sit to supine in bed with SUPERVISION within 4-6 days.   (2.)Brian Buckley will transfer from bed to chair and chair to bed with SUPERVISION using the least restrictive device within 4-6 days.   (3.)Brian Buckley will ambulate with SUPERVISION for 300 feet with the least restrictive device within 4-6 days.   (4.)Brian Buckley will ambulate up/down 3 steps with bilateral railing with STAND BY ASSIST with no device within 4-6 days.  (5.)Brian Buckley will state/observe THA precautions with 0 verbal cues within 4-6 days.  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  OBJECTIVE:    Pain Intensity 1: 4Pain Location 1: HipPain Orientation 1: LeftPain Intervention(s) 1: Medication (see  PTA Meds/MAR)  Gait Training  Assistive Device: Walker, rolling  Ambulation - Level of Assistance: CGA  Distance (ft): 180 Feet (ft)  Speed/Cadence: Delayed  Step Length: Left shortened;Right shortened  Stance: Left decreased  Gait Abnormalities: Antalgic    Surface:level    Bed Mobility  Supine to Sit: Minimal assistance  Sit to Supine:  (left up in chair)     Transfers  Sit to Stand: Assist X1;Minimum assistance  Stand to Sit: Assist X1;Minimum assistance;Verbal cues     Balance  Sitting: Intact  Standing: Intact;With support     Left Hip Cold  Type: Cold/ice pack  Patient Position: Sitting        EXERCISES   AM    PM   Active Active Assist   Passive   Comments   GROUP THERAPY [ ]    [ ]              Ankle Pumps 15   [X]    [ ]    [ ]        Quad Sets 15   [X]    [ ]    [ ]        Gluteal Sets 15   [X]    [ ]    [ ]        Hip Abd/Adduction 15   [ ]    [X]    [ ]        Straight Leg Raises     [ ]    [ ]    [ ]        Heel Slides 15   [X]    [ ]    [ ]        Short Arc Quads     [ ]    [ ]    [ ]        Long Arc  Quads     [ ]    [ ]    [ ]        Chair Slides     [ ]    [ ]    [ ]              [ ]    [ ]    [ ]          Treatment Times:              Therapeutic Exercise:  15            Gait Training:  15            Therapeutic Activity:                Neuromuscular Re-education:    Education:  [ ]   Home Exercises   [ ]   Going Home Video           [X]   Hip Precautions              [ ]   Fall Precautions    [ ]   Knee/Hip Prosthesis Review  [X]   Walker Management/Safety   [ ]   Other:   Safety:    After treatment precautions: [ ]   Bed            [ ]   Rails Up        [X]   Chair     [X]   Essentials within Reach    [ ]   Restraint in place      [X]   Caregiver present  [ ]   RN notified              [ ]   Bed Alarm/Tab Alert applied       ASSESSMENT:    ?? Patient???s response to today???s session was: tolerated well with no complications.   ?? Patient is demonstrating: moderate progress towards goal(s).   ?? Compliance with program/exercises: compliant most of the time.   ?? Recommended level of rehabilitation at time of discharge (pending progress): Home Health   ?? Other Comments/Recommendations/DME: rolling walker          Plan/Intent for next treatment: Physical therapy for bed mobility, transfers, gait training, strength/ROM exercises, modalities for pain, and patient education.    Continue to follow patient twice daily for  duration of hospital stay to address goals per initial evaluation.  PT Patient Time In/Time Out  Time In: 0930  Time Out: 1000  JULIE K MCCORMICK, PT

## 2010-09-22 NOTE — Other (Signed)
NARP - per patient

## 2010-09-22 NOTE — Op Note (Signed)
[facility]   [address]   [place], [place]. 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    NAME OF PROCEDURE: Revision of left total hip arthroplasty.    SURGEON: Fredirick Lathe, MD    ANESTHESIA: Spinal.    SPECIMENS: None.    COMPLICATIONS: None.    BLOOD LOSS: 400 mL.    INDICATIONS FOR PROCEDURE: This is an unfortunate gentleman who has had  a left metal-on-metal DePuy AMR hip replacement for some time. He has  increasing pain in his thigh. Selective injection allowed him relief.  Workup was negative for any dramatic concern with infection, but because  of persistent pain and response to injections, decision was made to  perform a revision arthroplasty.    DESCRIPTION OF PROCEDURE: The patient was taken to the operating room  where after induction with spinal block, was prepped in the usual sterile  fashion over the left hip. Ancef was preoperatively IV. The old incision  was utilized, carried through the subcutaneous tissues and fascia with  hemostasis obtained with Bovie cauterization. The external rotators were  released from their reattachment and the capsule teed. With this  traumatic synovial fluid was expressed, cloudy in nature. This was  cultured.    Further exposure was then accomplished. The capsule teed and the hip  dislocated. The femoral head was tamped off of the S-ROM stem. The stem  was comfortable and in about 30 degree of flexion was stable.    An acetabular retractor was then applied and a pouch was made superior  anterior to accommodate the trunnion. The soft tissue was used to expose  all around the acetabular shell. Using a revision osteotome with the   appropriate sized head, the areas were scored around the cup and then to  a deeper degree, the cup then popped free with the device. There was no  discontinuity of the acetabulum.    He was reamed to 56 mm with some bone removal as well as more  medialization and then to 58 mm.    At 58 mm, the cup was impacted and about 40 degrees horizontal with about  20 degrees of anteversion. Noted to sit quite nicely; it was stable. Two  screws were placed in the ileum for additional fixation.    A 40 mm Hylamer linear was then placed posterosuperiorly. He was placed  through range of motion, noted to be most stable at the +3 neck length.  This was thought to get him a little bit more length. He was thought to  be a little bit short preoperatively, and also with medialization of the  cap, a few millimeters was lost. It was thought to be lengthened at  approximately 5 mm in total. This was deemed to be appropriate flexion  and internal rotation and extension and appropriate pistoning and  equilibration of limb length.    All trial components were removed. A true 40 mm Hylamer lined  polyethylene was then impacted place at the Pinnacle cup. A 40 mm +3 head  was then tamped into place on the trunnion and noted to be stable, placed  through range of motion to be stable.    It should be noted a final closing culture was obtained prior to  implantation of the true plastic linear.    Additionally, irrigation ensued. The area was injected with Marcaine and  morphine and Toradol.    PDS #1 sutures reapproximate the capsule that was teed and #5 Monocryl  was used to reapproximate the external rotators into the origin where  they were freed.    PDS #1 was used to close the skin, as well as deep adipose layer. INSORB  sutures were used to reapproximate the adipose layer, and staples were  used to close the skin. Awaiting transfer to recovery room at the time of  dictation.                Jacquelin Hawking, MD A      This is an unverified document unless signed by physician.    TID: wmx DT: 09/22/2010 3:18 A  JOB: [id number] DOC#: [id number] DD: 09/21/2010    cc: Jacquelin Hawking, MD   Phys Other

## 2010-09-22 NOTE — Progress Notes (Signed)
Uneventful day.  Pain to hip controlled.  Appetite good.  No changes in assessment.  Encouraged i/S.  Wife at bedside.

## 2010-09-22 NOTE — Progress Notes (Signed)
Participated in interdisciplinary rounds (idr).    Howard Daniel Kirkpatrick, MDiv, BCC  Chaplain

## 2010-09-22 NOTE — Progress Notes (Signed)
Pt resting well in bed.  Tolerated breakfast well without c/os of n/v.  Pain to hip controlled.  Assessment as charted.

## 2010-09-22 NOTE — Progress Notes (Signed)
Lying quietly with eyes closed. Respirations even and unlabored. No distress noted. Family member at bedside.

## 2010-09-22 NOTE — Progress Notes (Signed)
[place] Pt. Last Name: [facility] Pt. First Name: Brian Buckley [address] MR#: [id number] / Admit#: [id number]   [place], [place] [address] DOB: 22-Mar-1946 / Age: 64  Attn.: Brian Buckley  Location: N5A - [address]08        Case Management - Progress Note  Initial Open Date: 09/07/2010   Case Manager: Brian Buckley, [place]    Initial Open Date: 09/07/2010  Social Worker: Brian Buckley BSW    Expected Date of Discharge: 09/24/2010  Transferred From: Home  ECF Bed Held Until:   Bed Held By:     Power of Attorney:   POA/Guardian/Conservator Capacity:   Primary Caregiver: Brian Buckley wife [phone]  Living Arrangements:     Source of Income:   Payee: medicare/aarp  Psychosocial History:   Cultural/Religious/Language Issues:   Education Level:   ADLS/Current Living Arrangements Issues: [place] arried with   support from wife will need dme planning home with hhc    Past Providers: na    Will patient perform self care at discharge? Y    Anticipated Discharge Disposition Goal: Home with Home Health Care    Assessment/Plan:   09/22/2010 10:48A Sw met with patient to review dc plan. Pt admitted for a   revision of his left hip. Pt is married with good support from his wife. he   will need dme. Sw placed referral with [facility]. Pt wishes to speak   with dtr concerning hhc agency in [place]. He will let sw know of agency.   Anticipate dc Friday if medically stable. Brian Buckley, BSW       Resources at Discharge:           Service Providers at Discharge:

## 2010-09-22 NOTE — Progress Notes (Signed)
Resting comfortably in bed.Left hip dressing dry and intact. Neurovascular status WDL. Denies needs at present. Dressing change using sterile technique. Incision staples intact. Moderate post-op edema noted. No bruising or blistering noted.Covered using sterile gauze and tegaderm. Instructed to call for any needs and for  assistance before getting out of bed. Side rails up x3. Call light within reach. Bed in low and locked position.Family member at bedside.

## 2010-09-22 NOTE — Progress Notes (Signed)
Problem: Self Care Deficits Care Plan (Adult)  Goal: *Acute Goals and Plan of Care (Insert Text)  SHORT TERM GOALS:  1. Patient will perform lower body bathing/dressing with contact guard assist with adaptive equipment within 1-3 day(s).   2. Patient will perform toileting / toilet transfer with contact guard assist with adaptive equipment within 1-3 day(s).   3. Patient will perform shower transfer with contact guard assist with adaptive equipment within 1-3 day(s).  4. Patient will state/observe 3/3 THA precautions with 2 verbal cues within 3 day(s).    LONG TERM GOALS:   1. Patient will perform lower body bathing/dressing with supervision with adaptive equipment within 4-7 day(s).   2. Patient will perform toileting / toilet transfer with supervision/set-up with adaptive equipment within 4-7 day(s).   3. Patient will perform shower transfer with supervision/set-up with adaptive equipment within 4-7 day(s).    OCCUPATIONAL THERAPY JOINT CAMP HIP ASSESSMENT  1 Day Post-Op  [X]             Initial Assessment              [ ]             7th Visit                     [ ]             Discharge  NAME/AGE/GENDER: Brian Buckley is a 64 y.o. male  DATE: 09/22/2010  PRIMARY DIAGNOSIS:S/P prosthetic total arthroplasty of the hip   Past Medical History   Diagnosis Date   ??? Hypertension     ??? Arthritis     ??? Nausea & vomiting     ??? Unspecified sleep apnea         does not use CPAP     Past Surgical History   Procedure Date   ??? Total hip arthroplasty 1995, 2009       right, left   ??? Hx orthopaedic         right big toe fusion   ??? Hx other surgical 1965       pilonial cyst     Patient Active Problem List   Diagnoses Code   ??? 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 S/P prosthetic total arthroplasty of the hip V43.64AQ       Prior Level of Function: independent with all self care   Home Situation  Home Environment: Private residence  # Steps to Enter: 5   One/Two Story Residence: One story  Living Alone: No   Support Systems: Spouse   Patient Expects to be Discharged to:: Private residence  Current DME Used/Available at Home: Environmental consultant  Interdisciplinary Collaboration: PT, OT and RN  SUBJECTIVE:  Pt was agreeable to tx session.   OBJECTIVE:Pt able to verbalize 0/3 THA precautions.  Reviewed all THA precautions with pt. Reviewed therapy schedule for today and tomorrow including plan for ADL's.   Pain Intensity 1:  (no complaint of pain during evaluation)  Pain Location 1: Hip  Pain Orientation 1: Left  Pain Intervention(s) 1: Medication (see  PTA Meds/MAR)   Bed Mobility  Supine to Sit: Minimal assistance  Sit to Supine:  (pt left up in chair)  Scooting: Additional timeFunctional Transfers  Sit to Stand: Minimum assistance  Stand to Sit: Minimum assistance  Toilet Transfer : Minimum assistance  Shower Transfer: Minimum assistanceBalance  Sitting: Intact  Standing: Pull to stand;With support  BRACES/ORTHOTICS: N/A  Gross Assessment: Yes  Gross Assessment  AROM: Within functional limits (except L LE, s/p THA)  Strength: Within functional limits (except L LE, s/p THA)  RUE Assessment  RUE Assessment (WDL): Within defined limits     RUE Strength, Tone, Sensation  RUE Strength, Tone, Sensation (WDL): Within defined limits     LUE Assessment  LUE Assessment (WDL): Within defined limits     LUE Strength, Tone, Sensation  LUE Strength, Tone, Sensation (WDL): Within defined limits     Mental Status  Level of Consciousness: Alert  Orientation Level: Oriented X4  Cognition: Appropriate decision making;Follows commands  Perception: Appears intact  Perseveration: No perseveration noted  Safety/Judgement: Awareness of environment    ADLs from General Assessment:  Basic ADL  Feeding: Supervision/set up (09/22/10 1133)  Oral Facial Hygiene/Grooming: Supervision/set up (seated) (09/22/10 1133)  Bathing: Minimum assistance (09/22/10 1133)  Upper Body Dressing: Supervision/set up (09/22/10 1133)   Lower Body Dressing: Moderate assistance (09/22/10 1133)  Toileting: Minimum assistance (09/22/10 1133)           Treatment Times:Initial Evaluation    Safety:    After treatment precautions: [ ]            Bed            [ ]            Rails Up        [X]           Chair     [X]            Essentials within Reach    [ ]            Restraint in place       [ ]            Caregiver present        [ ]            RN notified         [ ]            Bed AlarmTab Alert applied  Pt was left with PT in room.    ASSESSMENT:  PROBLEM LIST:  1. Decreased independence with self-care  2. Decreased independence with mobility in ADL's  3. Decreased knowledge of hip precautions    PLAN OF CAREPt requires skilled OT to maximize independence with self care tasks, functional transfers and safety precautions.  INTERVENTIONS PLANNED:  1. Self care training  2. Therapeutic ex/activity  3. Pt education  4.Functional mobility in ADL's    Frequency/Duration:  Continue to follow patient 3 times a week for until goals met to address goals as stated above.    REHABILITATION POTENTIAL FOR STATED GOALS:   Good   Benefits and precautions of occupational therapy have been discussed with the patient.      ?? Patient???s response to today???s session was: tolerated well with no complications   ?? Compliance with program/exercises: YES   ?? Recommended level of rehabilitation at time of discharge (pending progress): Home Health   ?? Other Comments/Recommendations/DME: RW, [facility]     OT Patient Time In/Time Out  Time In: 0920  Time Out: 0945  Thank you for this referral,  Elmer Bales, OT

## 2010-09-22 NOTE — Progress Notes (Signed)
Problem: Interdisciplinary Rounds  Goal: Interdisciplinary Rounds  Outcome: Progressing Towards Goal  Interdisciplinary team rounds were held 09/22/2010 with the following team members:Care Management, Nursing, Pastoral Care, Pharmacy and Physical Therapy and the patient.  Plan of Care options were discussed with the team and the patient.  The patient would benefit from a screening and/or visit from [facility].    Darien Ramus will arrange through [employer].

## 2010-09-22 NOTE — Op Note (Signed)
Op Notes signed by Claris Gower, MD at 09/22/10 2001                 Author: Claris Gower, MD  Service: --  Author Type: Physician       Filed: 09/22/10 2001  Date of Service: 09/22/10 0318  Status: Signed          Editor: Claris Gower, MD (Physician)          <!--EPICS-->                            [facility]<BR>                          [address]<BR>                          [place],  [place]. [address]<BR>                               [phone]<BR> <BR>                             OPERATIVE REPORT<BR> <BR> NAME:  Brian Buckley, Brian Buckley                             MR:  [id number]<QM> [name]<BR> LOC:  W3O 57846             SEX:  M                ACCT:  [id number] DOB:  [birth year]            AGE:  64              PT:  I<BR> ADMIT:  09/21/2010          DSCH:                 MSV:  SUR<BR> <BR> <BR> PREPROCEDURE DIAGNOSIS: Painful metal-on-metal AMR left total hip<BR> arthroplasty.<BR> <BR> POSTPROCEDURE  DIAGNOSIS: Painful metal-on-metal AMR left total hip<BR> arthroplasty.<BR> <BR> NAME OF PROCEDURE:  Revision of left total hip arthroplasty.<BR> <BR> SURGEON:  Fredirick Lathe, MD<BR> <BR> ANESTHESIA:  Spinal.<BR> <BR> SPECIMENS:  None.<BR> <BR> COMPLICATIONS:   None.<BR> <BR> BLOOD LOSS:  400 mL.<BR> <BR> INDICATIONS FOR PROCEDURE:  This is an unfortunate gentleman who has had<BR> a left metal-on-metal DePuy AMR hip replacement for some time. He has<BR> increasing pain in his thigh. Selective injection allowed  him relief.<BR> Workup was negative for any dramatic concern with infection, but because<BR> of persistent pain and response to injections, decision was made to<BR> perform a revision arthroplasty.<BR> <BR> DESCRIPTION OF PROCEDURE:  The patient was taken  to the operating room<BR> where after induction with spinal block, was prepped in the usual sterile<BR> fashion over the left hip. Ancef was preoperatively IV. The old incision<BR> was utilized, carried  through the subcutaneous tissues and fascia with<BR>  hemostasis obtained with Bovie cauterization. The external rotators were<BR> released from their reattachment and the capsule teed. With this<BR> traumatic synovial fluid was expressed, cloudy in nature. This was<BR> cultured.<BR> <BR> Further exposure  was then accomplished. The capsule teed and the hip<BR> dislocated. The femoral head was tamped off of the S-ROM stem. The stem<BR> was comfortable and in  about 30 degree of flexion was stable.<BR> <BR> An acetabular retractor was then applied and a pouch  was made superior<BR> anterior to accommodate the trunnion. The soft tissue was used to expose<BR> all around the acetabular shell. Using a revision osteotome with the<BR> appropriate sized head, the areas were scored around the cup and then to<BR> a  deeper degree, the cup then popped free with the device. There was no<BR> discontinuity of the acetabulum.<BR> <BR> He was reamed to 56 mm with some bone removal as well as more<BR> medialization and then to 58 mm.<BR> <BR> At 58 mm, the cup was impacted  and about 40 degrees horizontal with about<BR> 20 degrees of anteversion. Noted to sit quite nicely; it was stable. Two<BR> screws were placed in the ileum for additional fixation.<BR> <BR> A 40 mm Hylamer linear was then placed posterosuperiorly. He  was placed<BR> through range of motion, noted to be most stable at the +3 neck length.<BR> This was thought to get him a little bit more length. He was thought to<BR> be a little bit short preoperatively, and also with medialization of the<BR> cap, a  few millimeters was lost. It was thought to be lengthened at<BR> approximately 5 mm in total. This was deemed to be appropriate flexion<BR> and internal rotation and extension and appropriate pistoning and<BR> equilibration of limb length.<BR> <BR> All  trial components were removed. A true 40 mm Hylamer lined<BR> polyethylene was then impacted place at the Pinnacle  cup. A 40 mm +3 head<BR> was then tamped into place on the trunnion and noted to be stable, placed<BR> through range of motion to be stable.<BR>  <BR> It should be noted a final closing culture was obtained prior to<BR> implantation of the true plastic linear.<BR> <BR> Additionally, irrigation ensued. The area was injected with Marcaine and<BR> morphine and Toradol.<BR> <BR> PDS #1 sutures reapproximate  the capsule that was teed and #5 Monocryl<BR> was used to reapproximate the external rotators into the origin where<BR> they were freed.<BR> <BR> PDS #1 was used to close the skin, as well as deep adipose layer. INSORB<BR> sutures were used to reapproximate  the adipose layer, and staples were<BR> used to close the skin. Awaiting transfer to recovery room at the time of<BR> dictation.<BR> <BR> <BR> <BR> <BR> <BR> <BR> <BR> Jacquelin Hawking, MD    A<BR> <BR>             This is an unverified document unless  signed by physician.<BR> <BR> TID:  wmx                                      DT:  09/22/2010  3:18 A<BR> JOB:  [id number]        DOC#:  [id number]           DD:  09/21/2010<BR> <BR> cc:   Jacquelin Hawking, MD<BR>       Phys Other<BR> <!--EPICE-->

## 2010-09-23 LAB — CULTURE, WOUND W GRAM STAIN
Culture result:: NO GROWTH
Culture result:: NO GROWTH
GRAM STAIN: 0
GRAM STAIN: 0

## 2010-09-23 LAB — HEMOGLOBIN: HGB: 10.8 g/dL — ABNORMAL LOW (ref 13.2–17.1)

## 2010-09-23 MED ORDER — ASPIRIN 81 MG TAB, DELAYED RELEASE
81 mg | ORAL_TABLET | Freq: Two times a day (BID) | ORAL | Status: DC
Start: 2010-09-23 — End: 2011-02-22

## 2010-09-23 MED ADMIN — ondansetron (ZOFRAN ODT) tablet 8 mg: ORAL | @ 10:00:00 | NDC 00781523906

## 2010-09-23 MED ADMIN — docusate sodium (COLACE) capsule 100 mg: ORAL | @ 13:00:00 | NDC 62584068311

## 2010-09-23 MED ADMIN — oxyCODONE (ROXICODONE) tablet 10 mg: ORAL | @ 03:00:00 | NDC 00406055262

## 2010-09-23 MED ADMIN — oxyCODONE (ROXICODONE) tablet 10 mg: ORAL | @ 20:00:00 | NDC 00406055262

## 2010-09-23 MED ADMIN — ceFAZolin (ANCEF) 2g IVPB: INTRAVENOUS | @ 02:00:00 | NDC 99990004319

## 2010-09-23 MED ADMIN — celecoxib (CELEBREX) capsule 200 mg: ORAL | @ 13:00:00 | NDC 00025152551

## 2010-09-23 MED ADMIN — aspirin delayed-release tablet 81 mg: ORAL | @ 13:00:00 | NDC 00904770418

## 2010-09-23 MED ADMIN — ondansetron (ZOFRAN ODT) tablet 8 mg: ORAL | @ 03:00:00 | NDC 00781523906

## 2010-09-23 MED ADMIN — enoxaparin (LOVENOX) injection 40 mg: SUBCUTANEOUS | @ 13:00:00 | NDC 00075062040

## 2010-09-23 MED ADMIN — oxyCODONE (ROXICODONE) tablet 10 mg: ORAL | @ 13:00:00 | NDC 00406055262

## 2010-09-23 MED ADMIN — ceFAZolin (ANCEF) 2g IVPB: INTRAVENOUS | @ 10:00:00 | NDC 99990004319

## 2010-09-23 MED ADMIN — ceFAZolin (ANCEF) 2g IVPB: INTRAVENOUS | @ 17:00:00 | NDC 99990004319

## 2010-09-23 MED FILL — CEFAZOLIN 2 G IN 100 ML 0.9% NS: INTRAVENOUS | Qty: 100

## 2010-09-23 MED FILL — OXYCODONE 5 MG TAB: 5 mg | ORAL | Qty: 2

## 2010-09-23 MED FILL — LOVENOX 40 MG/0.4 ML SUBCUTANEOUS SYRINGE: 40 mg/0.4 mL | SUBCUTANEOUS | Qty: 0.4

## 2010-09-23 MED FILL — ASPIRIN 81 MG TAB, DELAYED RELEASE: 81 mg | ORAL | Qty: 1

## 2010-09-23 MED FILL — CELEBREX 200 MG CAPSULE: 200 mg | ORAL | Qty: 1

## 2010-09-23 MED FILL — ONDANSETRON 8 MG TAB, RAPID DISSOLVE: 8 mg | ORAL | Qty: 1

## 2010-09-23 MED FILL — DOCUSATE SODIUM 100 MG CAP: 100 mg | ORAL | Qty: 1

## 2010-09-23 NOTE — Progress Notes (Signed)
Problem: Mobility Impaired (Adult and Pediatric)  Goal: *Acute Goals and Plan of Care (Insert Text)  SHORT TERM GOALS:  (1.)Mr. Brian Buckley will move from supine to sit and sit to supine in bed with CONTACT GUARD ASSIST within 1-3 days.   (2.)Mr. Brian Buckley will transfer from bed to chair and chair to bed with CONTACT GUARD ASSIST using the least restrictive device within 1-3 days.   (3.)Mr. Brian Buckley will ambulate with CONTACT GUARD ASSIST for 150 feet with the least restrictive device within 1-3 days. GOAL MET 09/22/2010    LONG TERM GOALS:  (1.)Mr. Brian Buckley will move from supine to sit and sit to supine in bed with SUPERVISION within 4-6 days.   (2.)Mr. Brian Buckley will transfer from bed to chair and chair to bed with SUPERVISION using the least restrictive device within 4-6 days.   (3.)Mr. Brian Buckley will ambulate with SUPERVISION for 300 feet with the least restrictive device within 4-6 days.   (4.)Mr. Brian Buckley will ambulate up/down 3 steps with bilateral railing with STAND BY ASSIST with no device within 4-6 days.  (5.)Mr. Brian Buckley will state/observe THA precautions with 0 verbal cues within 4-6 days.  ________________________________________________________________________________________________  Outcome: Progressing Towards Goal    ACUTE PHYSICAL THERAPY JOINT CAMP DAILY NOTE  2 Days Post-Op    Primary Diagnosis: FAILED LEFT TOTAL HIP COMPONENTS/PAINFUL LEFT HIP              Procedure(s) and Anesthesia Type:     * HIP ARTHROPLASTY TOTAL REVISION - Spinal (Left)     Interdisciplinary Collaboration: Registered Nurse and Rehabilitation Attendant  SUBJECTIVE:  Patient stated ???I am doing good, I am going to go home today???.  OBJECTIVE:    Pain Intensity 1: 6     Left Side Weight Bearing: As tolerated  Gait Training  Assistive Device: Walker, rolling  Ambulation - Level of Assistance: Supervision/Set-up  Distance (ft): 288 Feet (ft) (and another 288 feet)  Interventions: Verbal cues  Duration: 15 Minutes   Speed/Cadence: Delayed  Step Length: Left shortened;Right shortened  Stance: Left decreased  Gait Abnormalities: Antalgic   Surface:level tile    Braces/Orthotics: none    Bed Mobility  Supine to Sit: CGA  Sit to Supine:  (pt left up in chair)     Transfers  Sit to Stand: Supervision  Stand to Sit: Supervision     Balance  Sitting: Intact  Standing: Intact;With support       Left Hip Cold  Type: Cold/ice pack  Patient Position: Sitting        EXERCISES   AM    PM   Active Active Assist   Passive   Comments   GROUP THERAPY [ ]   [ ]             Ankle Pumps   20 [X]   [ ]   [ ]       Quad Sets   20 [X]   [ ]   [ ]       Gluteal Sets   20 [X]   [ ]   [ ]       Hip Abd/Adduction   20 [X]   [ ]   [ ]       Straight Leg Raises     [ ]   [ ]   [ ]       Heel Slides   20 [X]   [ ]   [ ]       Short Arc Quads   20 [X]   [ ]   [ ]       Long Arc  Quads   20 [X]   [ ]   [ ]       Chair Slides     [ ]   [ ]   [ ]             [ ]   [ ]   [ ]         Treatment Times:              Therapeutic Exercise: 15 Minutes              Gait Training: 15 Minutes              Therapeutic Activity:                Neuromuscular Re-education:    Education:  [X]  Home Exercises     [ ]  Going Home Video   [ ]  Hip Precautions      [ ]  Fall Precautions       [ ]  Knee/Hip Prosthesis Review  [X]  Walker Management/Safety   [ ]  Other:   Safety:    After treatment precautions: [ ]  Bed            [ ]  Rails Up        [X]  Chair     [X]  Essentials within Reach    [ ]  Restraint in place      [X]  Caregiver present  [ ]  RN notified    [ ]  Bed Alarm/Tab Alert applied            ASSESSMENT:  Continues to make great progress with gait and THA exercises  ?? Patient???s response to today???s session was: tolerated well with no complications.   ?? Patient is demonstrating: significant progress towards goal(s).   ?? Compliance with program/exercises: compliant all of the time.   ?? Recommended level of rehabilitation at time of discharge (pending progress): Home Health    ?? Other Comments/Recommendations/DME: has a RW and a BSC               Plan/Intent for next treatment: Physical therapy for bed mobility, transfers, gait training, strength/ROM exercises, modalities for pain, and patient education.            Continue to follow patient twice daily for  until goals met to address goals per initial evaluation.  PT Patient Time In/Time Out  Time In: 1345  Time Out: 1415  MICHELLE N HOLMES, PT

## 2010-09-23 NOTE — Progress Notes (Signed)
Lying quietly in bed with no needs or complaints voiced.  Respirations even and unlabored. No distress noted. Family member at bedside.

## 2010-09-23 NOTE — Progress Notes (Addendum)
Problem: Mobility Impaired (Adult and Pediatric)  Goal: *Acute Goals and Plan of Care (Insert Text)  SHORT TERM GOALS:  (1.)Brian Buckley will move from supine to sit and sit to supine in bed with CONTACT GUARD ASSIST within 1-3 days.   (2.)Brian Buckley will transfer from bed to chair and chair to bed with CONTACT GUARD ASSIST using the least restrictive device within 1-3 days.   (3.)Brian Buckley will ambulate with CONTACT GUARD ASSIST for 150 feet with the least restrictive device within 1-3 days. GOAL MET 09/22/2010    LONG TERM GOALS:  (1.)Brian Buckley will move from supine to sit and sit to supine in bed with SUPERVISION within 4-6 days.   (2.)Brian Buckley will transfer from bed to chair and chair to bed with SUPERVISION using the least restrictive device within 4-6 days.   (3.)Brian Buckley will ambulate with SUPERVISION for 300 feet with the least restrictive device within 4-6 days.   (4.)Brian Buckley will ambulate up/down 3 steps with bilateral railing with STAND BY ASSIST with no device within 4-6 days.  (5.)Brian Buckley will state/observe THA precautions with 0 verbal cues within 4-6 days.  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  OBJECTIVE:    Pain Intensity 1: 2     Left Side Weight Bearing: As tolerated  Gait Training  Assistive Device: Walker, rolling  Ambulation - Level of Assistance: SBA  Distance (ft): 288 Feet (ft) (and another 288 feet)  Interventions: Verbal cues  Duration: 15 Minutes  Speed/Cadence: Delayed   Step Length: Left shortened;Right shortened  Stance: Left decreased  Gait Abnormalities: Antalgic   Surface:level tile    Braces/Orthotics: none    Transfers  Sit to Stand: Supervision  Stand to Sit: Supervision     Balance  Sitting: Intact  Standing: Intact;With support       Left Hip Cold  Type: Cold/ice pack  Patient Position: Sitting        EXERCISES   AM    PM   Active Active Assist   Passive   Comments   GROUP THERAPY [X]   [ ]             Ankle Pumps 20   [X]   [ ]   [ ]       Quad Sets 20   [X]   [ ]   [ ]       Gluteal Sets 20   [X]   [ ]   [ ]       Hip Abd/Adduction 20   [X]   [ ]   [ ]       Straight Leg Raises     [ ]   [ ]   [ ]       Heel Slides 20   [X]   [ ]   [ ]       Short Arc Quads 20   [X]   [ ]   [ ]       Long Arc Quads 20   [X]   [ ]   [ ]       Chair Slides     [ ]   [ ]   [ ]             [ ]   [ ]   [ ]   Treatment Times:              Therapeutic Exercise: 45 Minutes (group)              Gait Training: 15 Minutes              Therapeutic Activity:                Neuromuscular Re-education:    Education:  [X]  Home Exercises     [ ]  Going Home Video   [ ]  Hip Precautions      [ ]  Fall Precautions       Arly.Keller ] Knee/Hip Prosthesis Review  [X]  Walker Management/Safety   [ ]  Other:   Safety:    After treatment precautions: [ ]  Bed            [ ]  Rails Up        [X]  Chair     [X]  Essentials within Reach    [ ]  Restraint in place      [X]  Caregiver present  [ ]  RN notified    [ ]  Bed Alarm/Tab Alert applied            ASSESSMENT:  Good progress with gait  ?? Patient???s response to today???s session was: tolerated well with no complications.   ?? Patient is demonstrating: significant progress towards goal(s).   ?? Compliance with program/exercises: compliant all of the time.   ?? Recommended level of rehabilitation at time of discharge (pending progress): Home Health   ?? Other Comments/Recommendations/DME: to be determined            Plan/Intent for next treatment: Physical therapy for bed mobility, transfers, gait training, strength/ROM exercises, modalities for pain, and patient education.            Continue to follow patient twice daily for  until goals met to address goals per initial evaluation.  PT Patient Time In/Time Out  Time In: 1030  Time Out: 1130  MICHELLE N HOLMES, PT

## 2010-09-23 NOTE — Progress Notes (Signed)
Problem: Self Care Deficits Care Plan (Adult)  Goal: *Acute Goals and Plan of Care (Insert Text)  SHORT TERM GOALS:  1. Patient will perform lower body bathing/dressing with contact guard assist with adaptive equipment within 1-3 day(s). Met for bathing GOAL MET 09/23/2010, discontinue for dressing wife will assist.  2. Patient will perform toileting / toilet transfer with contact guard assist with adaptive equipment within 1-3 day(s).   3. Patient will perform shower transfer with contact guard assist with adaptive equipment within 1-3 day(s). GOAL MET 09/23/2010  4. Patient will state/observe 3/3 THA precautions with 2 verbal cues within 3 day(s). GOAL MET 09/23/2010      LONG TERM GOALS:   1. Patient will perform lower body bathing/dressing with supervision with adaptive equipment within 4-7 day(s).   2. Patient will perform toileting / toilet transfer with supervision/set-up with adaptive equipment within 4-7 day(s).   3. Patient will perform shower transfer with supervision/set-up with adaptive equipment within 4-7 day(s).   OCCUPATIONAL THERAPY JOINT CAMP DAILY NOTE  2 Days Post-Op    Primary Diagnosis:S/P prosthetic total arthroplasty of the hip    Interdisciplinary Collaboration: RN  SUBJECTIVE:  Pt was agreeable to tx session. Pt stated he was planning on going home after pm therapy.  OBJECTIVE: Pt seen in room for shower and self care training with wife present.  Wife educated on safely assisting with self care.  Pain Intensity 1:  (no complaint of pain during self care)    RUE Assessment  RUE Assessment (WDL): Within defined limits     RUE Strength, Tone, Sensation  RUE Strength, Tone, Sensation (WDL): Within defined limits     LUE Assessment  LUE Assessment (WDL): Within defined limits     LUE Strength, Tone, Sensation  LUE Strength, Tone, Sensation (WDL): Within defined limits     Mental Status  Level of Consciousness: Alert  Orientation Level: Oriented X4  Cognition: Appropriate decision making   Perception: Appears intact  Perseveration: No perseveration noted  Safety/Judgement: Awareness of environment    ADLs from ADL Navigator:  Grooming/Bathing/Dressing Activities of Daily Living   Grooming  Grooming Assistance: Supervision/set up (standing at sink) Cognitive Retraining  Safety/Judgement: Awareness of environment   Upper Body Bathing  Bathing Assistance: Supervision/set-up  Position Performed: Standing  Adaptive Equipment: Grab bar;Walker    Lower Body Bathing  Bathing Assistance: Supervision/set-up  Perineal  : Supervision/set-up  Position Performed: Standing  Adaptive Equipment: Walker;Grab bar  Lower Body : Supervision/set-up  Position Performed: Seated in chair  Cues: Verbal cues provided  Adaptive Equipment: Walker;Grab bar;Long handled sponge;Shower Secondary school teacher Assistance  Dressing Assistance: Supervision/set-up Functional Transfers  Bathroom Mobility: Supervision/set up  Shower Transfer: Supervision/set up  Cues: Verbal cues provided  Adaptive Equipment: Grab bars;Walker (comment);Shower chair with back   Lower Body Dressing Assistance  Dressing Assistance: Minimum assistance  Position Performed: Seated in chair  Cues: Verbal cues provided  Adaptive Equipment Used: Grab bar;Walker (pt educated on reacher, declined use stated wife would help) Bed/Mat Mobility  Supine to Sit: CGA  Sit to Supine:  (pt left up in chair)  Sit to Stand: Supervision  Scooting: Additional time  Cues: Verbal cues provided       Treatment Times:              Therapeutic Exercise: 0 minutes              Self Care Training: 30 minutes  Therapeutic Activity: 0 minutes              Neuromuscular Re-education: 0 minutes              Other: 0 minutes    Safety:    After treatment precautions:      [ ]        Bed            [ ]        Rails Up        [X]  Chair    [X]        Essentials within Reach    [ ]        Restraint in place        [ ]        Caregiver present        [ ]        RN notified         [ ]        Bed alarm/Tab Alert applied  Pt was left in chair with caregiver in room.  Nrsg aware that pt is in chair and pt educated on need to get assistance if moving out of chair.       ASSESSMENT:      ?? Patient???s response to today???s session was: tolerated well with no complications.   ?? Patient is demonstrating: moderate progress towards goal(s).   ?? Compliance with program/exercises: YES   ?? Recommended level of rehabilitation at time of discharge (pending progress): Home Health   ?? Other Comments/Recommendations/DME: BSC[facility] RW   Plan/Intent for next treatment:  Continue to follow patient 3 times a week for  until goals met to address goals per initial evaluation.  OT Patient Time In/Time Out  Time In: 0930  Time Out: 1000    Donavon Ludwig, OTR/L

## 2010-09-23 NOTE — Progress Notes (Signed)
Pt resting well in bed.  Dressing to hip dry and intact.  Pt has strong push/ pulls and normal neurovascular checks.  Pt encouraged i/s and po fluids.  Discharge plans in progress for this afternoon following PT session.

## 2010-09-23 NOTE — Progress Notes (Signed)
Pt discharge summary and home medications reviewed and signed by pt.  Oxycodone 2 tabs given po prior to leaving hospital for pain after PT session.  Pt inst to wear TED hose and to use I/S as inst in hospital..  Pt voices understanding.  Pt left hospital via w/c with family.

## 2010-09-23 NOTE — Progress Notes (Signed)
September 23, 2010         Post Op day: 2 Days Post-Op     Admit Date: 09/21/2010  Admit Diagnosis: OA        Subjective: Doing well, No complaints, No SOB, No Chest Pain, No Nausea or Vomitting     Objective:   Vital Signs are Stable, No Acute Distress, Alert and Oriented, Dressing is Dry,  Neurovascular exam is normal.     Assessment:Patient Active Problem List   Diagnoses Code   ??? 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 S/P prosthetic total arthroplasty of the hip V43.64AQ      Empty flowsheet group.   Temp (24hrs), Avg:98.5 ??F (36.9 ??C), Min:98.2 ??F (36.8 ??C), Max:98.8 ??F (37.1 ??C)    Body mass index is 37.20 kg/(m^2).     Plan: Continue Physical Therapy, Monitor Hbg, Discuss with supervising physician. Dc home after second PT        Signed By: Deberah Castle, PA

## 2010-09-27 LAB — CULTURE, ANAEROBIC: Culture result:: NO GROWTH

## 2010-09-29 LAB — CULTURE, ANAEROBIC: Culture result:: NO GROWTH

## 2010-10-19 NOTE — Discharge Summary (Signed)
[facility]  Total Joint Discharge Summary      Patient ID:  Brian Buckley  [id number]  64 y.o.  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 If allergic to Ancef or due to other indications, patient was given Vancomycin      Hospital Medications given: No current facility-administered medications on file.     Current outpatient prescriptions   Medication Sig   ??? aspirin delayed-release 81 mg tablet Take 1 Tab by mouth two (2) times a day.   ??? metoprolol-XL (TOPROL-XL) 100 mg XL tablet Take 100 mg by mouth daily (after lunch). Patient instructed to take morning of surgery per anesthesia guidelines       ??? telmisartan-hydrochlorothiazide (MICARDIS HCT) 80-12.5 mg per tablet Take 1 Tab by mouth daily.             Additional DVT Prophylaxis:  TED Hose,Plexi-Pulse    Postoperative transfusions: none  Post Op complications: none    Hemoglobin at discharge: Lab Results   Component Value Date/Time    HGB 10.8 09/23/10 04:08 AM         Wound appears to be healing without any evidence of infection.   Physical Therapy started on the day of surgery and progressed.   PT/OT:          Assistive Device: Walker (comment)                Discharged to: home with [facility]    Discharge instructions:  -Rx pain medication given  - Anticoagulate with: Ecotrin 81 mg PO BID x 5 weeks unless patient is on Coumadin, plavix, or similar medication   -Resume pre hospital diet             -Resume home medications per medical continuation form     -Ambulate with walker, appropriate total joint protocol  -Follow up in office as scheduled        Signed:  Deberah Castle, PA  10/19/2010  7:21 AM

## 2011-02-22 LAB — MSSA/MRSA SC BY PCR, NASAL SWAB

## 2011-02-22 LAB — CBC W/O DIFF
HCT: 41.8 % (ref 41.1–50.3)
HGB: 14.5 g/dL (ref 13.2–17.1)
MCH: 29.4 PG (ref 26.1–32.9)
MCHC: 34.7 g/dL (ref 31.4–35.0)
MCV: 84.6 FL (ref 79.6–97.8)
MPV: 10.2 FL — ABNORMAL LOW (ref 10.8–14.1)
PLATELET: 187 10*3/uL (ref 150–450)
RBC: 4.94 M/uL (ref 4.23–5.67)
RDW: 13.5 % (ref 11.9–14.6)
WBC: 8.4 10*3/uL (ref 4.3–11.1)

## 2011-02-22 LAB — METABOLIC PANEL, BASIC
Anion gap: 7 mmol/L (ref 7–16)
BUN: 17 MG/DL (ref 8–23)
CO2: 27 MMOL/L (ref 23–32)
Calcium: 8 MG/DL — ABNORMAL LOW (ref 8.3–10.4)
Chloride: 105 MMOL/L (ref 98–107)
Creatinine: 1.2 MG/DL (ref 0.8–1.5)
GFR est AA: >60 mL/min/{1.73_m2} (ref >60)
GFR est non-AA: >60 mL/min/{1.73_m2} (ref >60)
Glucose: 91 MG/DL (ref 65–100)
Potassium: 3.7 MMOL/L (ref 3.5–5.1)
Sodium: 139 MMOL/L (ref 136–145)

## 2011-02-22 LAB — URINALYSIS W/O MICRO
Bilirubin: NEGATIVE
Blood: NEGATIVE
Glucose: NEGATIVE MG/DL
Ketone: NEGATIVE MG/DL
Leukocyte Esterase: NEGATIVE
Nitrites: NEGATIVE
Protein: NEGATIVE MG/DL
Specific gravity: >1.03 — ABNORMAL HIGH (ref 1.001–1.023)
Urobilinogen: 0.2 EU/DL (ref 0.2–1.0)
pH (UA): 6 (ref 5.0–9.0)

## 2011-02-22 LAB — PROTHROMBIN TIME + INR
INR: 0.9 (ref 0.9–1.2)
Prothrombin time: 10.1 s (ref 8.6–12.2)

## 2011-02-22 LAB — PTT: aPTT: 22.1 s — ABNORMAL LOW (ref 25.3–32.9)

## 2011-02-22 NOTE — Other (Signed)
Verified with patient: Patient's name, name of surgeon, procedure to be performed and date of procedure. All correct in system.    Type 3 case    Pt declined offer to see anesthesia today.  Labs done per orders/anesthesia protocols(CBC,BMP,PT,PTT,UA). Patient instructed to continue all medications as normally taken up until day of surgery unless otherwise noted/instructed. Instructed to only take medications marked "YES" on Medication Reconciliation Form the morning of surgery- as per Anesthesia instructions/protocols. Patient verbalized understanding of instructions; given copy of Medication Reconciliation Form for reference.    Patient given opportunity to ask questions related to instructions given during pre- assessment.  All questions answered. Informed pt to refer to printed materials provided and to call with any questions between now and the DOS. Phone # printed on materials given to patient.      Patient instructed on Hibiclens usage and skin cleanser provided. Patient verbalized understanding.    MRSA nasal swab collected per MD orders. Instructed on Mupirocin ointment and provided information sheet. Pt verbalized understanding. If needed medication to be called into preferred pharmacy as listed in patient information.     Labs within anesthesia guidelines    No EKG required per anesthesia protocol.

## 2011-02-22 NOTE — Other (Signed)
Call to Margie/[facility] at [phone].  Verbal orders received for CBC, UA, PT, PTT, BMP, EKG per anesthesia protocol, and MRSA/SA nasal swab, for positive nasal swab: call Called in Mupirocin ointment 22 gram tube.  Apply to both nares BID starting five days prior to surgery for a total of ten doses.

## 2011-02-22 NOTE — Progress Notes (Signed)
[facility]  [address] [place], [place] [address]  [phone]    Prehab Plan of Treatment and Evaluation Summary     Consult Date: 02/22/2011  Referring Physician: Claris Gower, MD  Medical Diagnosis: OA  Treatment Diagnosis: Right hip pain and weakness in joint, pelvic region and thigh  Phone Number: [phone] (home)   Date of Surgery: 03/24/11  Home Situation:   Home Situation  Home Environment: Private residence  # Steps to Enter: 5   Rails to Enter: No  One/Two Story Residence: One story  Living Alone: No  Support Systems: Spouse  Patient Expects to be Discharged to:: Private residence  Current DME Used/Available at Home: Cane, straight;Commode, bedside;Walker, rolling  Tub or Shower Type: Tub/Shower combination  Functional limitations prior to injury or illness:    Bathing/Showering:   [x]  (0) No Problems with this activity  []  (1) Pain level increases with this activity  []  (1) Assistive Device with this activity  []  (1) Extra Time needed with this activity  []  (1) Assistance from another person  []  (5) Only able to do sponge bath Ambulation:  [x]  (0) No Problems with this activity  []  (1) Assistive Device with this activity  []  (1) Unable to go up and down stairs  []  (1) Able to walk short distances only  []  (1) Hold onto Furniture/countertops  []  (1) Assistance from another person  []  (5) Transfers Only/No walking     Dressing:  [x]  (0) No Problems with this activity  []  (1) Pain level increases with this activity  []  (1) Assistive Device with this activity  []  (1) Extra Time needed with this activity  []  (1) Assistance from another person  []  (5) Someone else dresses my lower body Household Activities:  []  (1) Routine house and yard work  [x]  (2) All but heavy work   []  (3) Housework only  []  (4) Light housework   []  (5) None      Additional Comments: Motivated. He has been here for a L THA revision. He did very well with this. He works in maintenance at a [employer]  Pain Screen  Pain Scale 1: Numeric (0 - 10)  Pain Intensity 1: 7  Pain Location 1: Hip  Pain Orientation 1: Right  Pain Description 1: Sharp;Shooting  Recent Pain History (0=no pain, 10=worst)  Least pain (over the previous 24 Hours): 5  Most pain (over the previous 24 Hours): 7  Statement of patient's goals: "return to work soon"  OBJECTIVE:  Gross Assessment  AROM: Within functional limits (L LE)  Strength: Within functional limits (L LE 4-5/5)  Sensation: Intact (L LE)        Distance (ft): 1000 Feet (ft)  Ambulation - Level of Assistance: Completely independent                    Gait Abnormalities: Antalgic  Surface:level tile                                        RLE AROM  R Hip Flexion: 120   R Hip ABduction: 20            RLE Strength  R Hip Flexion: 4  R Hip ABduction: 4  R Knee Extension: 5  R Ankle Dorsiflexion: 5    Prehab Problem: Decreased strength and increased pain in  right lower extremity(s).  Prehab Goal:  Pt. will increase strength via home exercise program to minimize functional deficits in regards to upcoming surgery using education, demonstration and by providing an informational handout including a home exercise program.  Prehab Plan:  This is a one time physical therapy visit and the patient will be discharged from physical therapy after this visit.  Time Frame: Two to three weeks  Treatment Plan Effective Dates: 02/22/2011 to 02/22/2011  Rehabilitation potential for stated goals: Good    PT Patient Time In/Time Out  Time In: 1245  Time Out: 1330  DENISE LEE FLASPOEHLER, PT

## 2011-02-23 NOTE — Other (Signed)
Prescription for Mupirocin 22 g tube, apply intranasally to both nostrils BID x5 days preoperatively or a total of 10 doses called to [facility], @ [phone]. Unable to notify patient of positive MSSA nasal swab. LM requesting return call.

## 2011-02-24 NOTE — Other (Signed)
Left message for patient to return call re: nasal swab results

## 2011-02-25 NOTE — Other (Addendum)
Left message for patient to return call re: nasal swab results    [phone] message left for Margie of multiple message left for pt to notify of swab results.

## 2011-03-08 NOTE — Op Note (Signed)
Joshua Pena, Joshua Pena               ACCOUNT NO.:  [id number]   MEDICAL RECORD NO.:  [id number]          PATIENT TYPE:  INP   LOCATION:  4142                         FACILITY:  [facility]   PHYSICIAN:  Feliberto Gottron. Turner Daniels, M.D.   DATE OF BIRTH:  09-19-1946   DATE OF PROCEDURE:  05/19/2008  DATE OF DISCHARGE:                               OPERATIVE REPORT   PREOPERATIVE DIAGNOSIS:  End-stage arthritis, left hip.   POSTOPERATIVE DIAGNOSIS:  End-stage arthritis, left hip.   PROCEDURE:  Left total hip arthroplasty using a DePuy 56-mm ASR cup, 18  x 13 x 42 x 160 S-ROM stem, 18-D large cone MK+0 49-mm ultimate ball.   SURGEON:  Feliberto Gottron.  Turner Daniels, MD   FIRST ASSISTANT:  Shirl Harris PA-C   ESTIMATED BLOOD LOSS:  700 mL.   FLUID REPLACEMENT:  2 L of crystalloid.   DRAINS PLACED:  Foley catheter.   URINE OUTPUT:  300 mL.   INDICATIONS FOR PROCEDURE:  This is a 65 year old gentleman with end-  stage arthritis of the left hip down to bone-on-bone.  He also has  obesity, which will make the surgery a little more difficult.  He has  failed conservative treatment with anti-inflammatory medicines,  narcotics, exercises, and attempts of weight loss.  Plain radiographs  showed bone-on-bone arthritic changes with early collapse of the left  femoral head.  Pain makes him limp.  He cannot walk more than couple of  blocks, wakes him up at night, and prevents normal activities.  Risks  and benefits of the surgery have been discussed.  Questions answered.  He desires elective left total hip arthroplasty.   DESCRIPTION OF PROCEDURE:  The patient identified by armband, taken to  the operating room at [facility] where the appropriate  anesthetic  monitors were attached and general endotracheal anesthesia  induced with the patient in supine position.  He did receive 2 g of  Ancef in the preoperative area.  A Foley catheter was inserted.  He was  then rolled into the right lateral decubitus  position, fixed there with  a Stulberg Mark II pelvic clamp.  Left lower extremity was then prepped  and draped in usual sterile fashion from the ankle to the hemipelvis.  Skin along the lateral hip and thigh were infiltrated with 25 mL of 0.5%  Marcaine and epinephrine solution into the subcutaneous tissue.  We then  made a posterolateral approach to the hip joint.  After the sterile prep  and drape, centered over the greater trochanter 20 cm in length going  from posterior superior to over the greater trochanter and then going  slightly anterior and distal.  Small bleeders in the skin and  subcutaneous tissue were identified and cauterized.  The IT band was cut  in line with the skin incision exposing the greater trochanter.  We then  placed Cobra retractors between the gluteus minimus and the superior hip  joint capsule between the quadratus femoris and the inferior hip joint  capsule.  This isolated the short external rotators and piriformis,  which were then cut off the insertion  on the intertrochanteric crest  exposing the posterior aspect of the hip joint capsule, which was then  developed into an acetabular-based flap and likewise incised going from  posterior and superior over the distal femoral neck and then going back  to the acetabular rim posterior and inferior.  This flap was tagged with  #2 Ethibond sutures exposing the femoral head.  The rectus femoris was  then released partially about 50% off of its origin.  Hip flexed and  internally rotated dislocating the femoral head.  A standard neck cut  was then performed one fingerbreadth above the lesser trochanter and tea  proximal femur was translated anteriorly, levering off the anterior  column with a Homan retractor.  A posterior inferior wing retractor was  then placed and a spike Cobra was placed in the cotyloid notch.  This  allowed [place] to resect the labrum and it caused to sequentially rim the  acetabulum up to 55-mm  basket reamer obtaining good coverage in all  quadrants, and finally we were able to clean the rim of the acetabulum  with a 56-mm basket reamer, the rim only.  At this point, the acetabulum  was irrigated out with normal saline solution.  A 56 ASR cup was  selected, loaded on the bent impactor, and impacted in 45 degrees of  abduction and about 15 degrees of anteversion following the natural  bone.  Good fill was obtained and the cup was stable to attempts manual  removal.  The hip was then flexed and internally rotated exposing the  proximal femur.  The exposure was enhanced with a Homan levering off the  anterior column, a spike Cobra underneath the femoral neck from  laterally, and then a Mueller coming from proximally.  We entered the  femoral canal with the box cutting at the round box-cutting chisel  followed by the initiating reamer, followed by axial reaming up to 11.5  where we got good chatter.  We kept reaming up to 13.5 to the full depth  and then a partial depth to the 14.  We then conically reamed up to a 18-  D cone to the appropriate depth thorough a 42 base neck and then reamed  the calcar to a D large calcar.  An 18-D large trial was then inserted  followed by the stem, MK+0 49-mm ball in about 5-10 degrees of  anteversion in relation to the calcar.  The hip was reduced.  Stability  was noted to be excellent in extension and could not be dislocated  anteriorly and in flexion to 90 took about 60 of internal rotation to  destabilize the hip.  Trial components were then removed.  We selected  an 18D large ZTT1 cone, hammered into place followed by the 18 x 13 x  160 x 42 stem.  This was hammered into place and seated well.  An MK+0  49-mm ultimate ball was then hammered on the stem and the hip once again  reduced.  Stability noted to be excellent.  The wound was once again  irrigated out with normal saline solution.  Small bleeders were  identified and cauterized.  The  capsular flap and short external  rotators were repaired back to the intertrochanteric crest through drill  holes.  IT band closed with running #1 Vicryl suture.  The subcutaneous  tissue with 0 and 2-0 undyed Vicryl suture and the skin with running  interlocking 3-0 nylon suture.  Dressing with Xeroform and Mepilex was  then  applied.  The patient was unclamped, rolled supine, awakened, and  taken to the recovery room without difficulty.      Feliberto Gottron. Turner Daniels, M.D.  Electronically Signed     FJR/MEDQ  D:  05/19/2008  T:  05/20/2008  Job:  [id number]

## 2011-03-08 NOTE — Op Note (Signed)
NAMESHAIN, PAUWELS               ACCOUNT NO.:  [id number]   MEDICAL RECORD NO.:  [id number]          PATIENT TYPE:  AMB   LOCATION:  DSC                          FACILITY:  [facility]   PHYSICIAN:  Feliberto Gottron. Turner Daniels, M.D.   DATE OF BIRTH:  Dec 07, 1945   DATE OF PROCEDURE:  10/13/2008  DATE OF DISCHARGE:                               OPERATIVE REPORT   PREOPERATIVE DIAGNOSIS:  End-stage arthritis, right great toe  metatarsophalangeal joint.   POSTOPERATIVE DIAGNOSIS:  End-stage arthritis, right great toe  metatarsophalangeal joint.   PROCEDURE:  Instrumented right great toe metatarsophalangeal joint  fusion using crossed 3.5 mm Synthes fully threaded screws in a lag  technique.   SURGEON:  Feliberto Gottron. Turner Daniels, MD   FIRST ASSISTANT:  Shirl Harris, PA-C   ANESTHETIC:  Ankle block with IV sedation.   ESTIMATED BLOOD LOSS:  Minimal.   FLUID REPLACEMENT:  800 mL crystalloid.   DRAINS PLACED:  None.   TOURNIQUET TIME:  30 minutes.   INDICATIONS FOR PROCEDURE:  A 65 year old man followed for the last year  and a half for end-stage arthritis of the right great toe MTP joint and  now desires elective right great toe MTP joint fusion.  He has failed  conservative treatment, anti-inflammatory medicines, cortisone  injection, and activity modification, but the pain is now waking him up  at night and plain radiographs were consistent with bone-on-bone  arthritic changes.  Risks and benefits of surgery discussed, questions  were answered.   DESCRIPTION OF PROCEDURE:  The patient identified by armband and  underwent right ankle block anesthetic at [facility].  He  was then taken to operating room #2.  Appropriate anesthetic monitors  were attached.  IV sedation administered.  Tourniquet applied to the  right ankle and he received 1 g of Ancef preoperatively.  Right foot was  then prepped and draped in usual sterile fashion from the toes to the  tourniquet.  Time-out procedure  performed.  Limb wrapped with an Esmarch  bandage.  Tourniquet inflated to 250 mmHg and began the operation itself  by making a lateral to anterolateral incision, longitudinal center with  a great toe MTP joint about 5 cm in length to the skin and subcutaneous  tissue.  Small bleeders were identified and cauterized.  We split the  capsule longitudinally over the arthritic MTP joint reflected anteriorly  and posteriorly.  Some loose osteophytes removed from the dorsal aspect  of the proximal phalanx and then using baby Cobra retractors to protect  the medial structures, the distal 2 mm of the metatarsal head resected  creating a flat plantar surface.  Likewise removed 2 mm from the  proximal phalanx creating a second flat plantar surface and then checked  the angles to make sure that the MTP joint angle created a heel height  of about 1 inch.  Satisfied with the position of the MTP joint, which  was around 20-25 degrees of dorsiflexion, we then held it in place with  a calibrated towel clips and passed crossed K-wires through the fusion  site and took  C-arm images to confirm good position of both segments of  bone, the distal metatarsal, and proximal phalanx.  We then removal of  the K-wires and drilled with a 2.5 gold drill, drilled proximally, and  the drill hole with 3.5 silver drill and then measured for the  appropriate length screw going from distal medial or proximal lateral  with one screw and then proximal medial to distal lateral with a second  screw.  Good firm fixation was obtained with both screws.  The fusion  site was immobilized and compressed nicely.  C-arm images were taken  confirming good position of the lag screws and the tourniquet was let  down.  The wound irrigated out with normal saline solution.  The capsule  closed with 3-0 Vicryl suture.  The subcutaneous tissue with running 3-0  Vicryl suture and the skin with 4-0 Monocryl.  A dressing of Xeroform,  4x4 dressing  sponges, Webril, and Ace wrap was then applied.  The  patient was placed in a Cam walker boot, awakened, and taken to the  recovery room without difficulty.      Feliberto Gottron. Turner Daniels, M.D.  Electronically Signed     FJR/MEDQ  D:  10/13/2008  T:  10/14/2008  Job:  [id number]

## 2011-03-11 NOTE — Discharge Summary (Signed)
NAMENICKALAUS, CROOKE               ACCOUNT NO.:  [id number]   MEDICAL RECORD NO.:  [id number]          PATIENT TYPE:  INP   LOCATION:  4142                         FACILITY:  [facility]   PHYSICIAN:  Feliberto Gottron. Turner Daniels, M.D.   DATE OF BIRTH:  06-21-1946   DATE OF ADMISSION:  05/19/2008  DATE OF DISCHARGE:  05/22/2008                               DISCHARGE SUMMARY   CHIEF COMPLAINT:  Left hip pain.   HISTORY OF PRESENT ILLNESS:  This is a 65 year old gentleman who  complains of unremitting pain in his left hip despite conservative  therapy with nonsteroidal antiinflammatories narcotic pain medicines.  He desires surgical intervention.  At that time, all risk and benefits  of surgery were discussed with the patient.   PAST MEDICAL HISTORY:  Significant for sleep apnea and hypertension.   PAST FAMILY PSYCHIATRIC HISTORY:  He had a right total hip arthroplasty  in 1995.  He had a shoulder arthroscopy.  He had fistula surgery in  1968.   SOCIAL HISTORY:  He is a nonsmoker.  He does not drink alcohol.  He is  married and retired.   FAMILY HISTORY:  Noncontributory.   ALLERGIES:  He has no known drug allergies.   CURRENT MEDICATIONS:  1. Micardis HCT 80/25 mg 1 p.o. daily.  2. Toprol ER 100 mg p.o. daily.  3. Aspirin 81 mg 1 p.o. daily.   PHYSICAL EXAMINATION:  Gross examination of the left hip demonstrates  the patient to have tenderness with internal rotation and an antalgic  gait.  He is neurovascularly intact.   X-rays demonstrates bone-on-bone degenerative joint disease of the left  hip.   PREOP LABS:  White blood cell is 9.2, red blood cells 4.95, hemoglobin  15.4, hematocrit 44, and platelets 241.  Sodium 137, potassium 3.8,  chloride 102, glucose 118, BUN 16, creatinine 1.15, PT 12.5, INR 0.9,  PTT 28.  The urinalysis was within normal limits.   HOSPITAL COURSE:  Mr. Deaton was admitted to [facility] on May 19, 2008 when he underwent a left total hip arthroplasty  that was  performed by Dr. Gean Birchwood using the DePuy system.  A preoperative  Foley catheter was placed.  The patient tolerated the procedure and was  transferred to the orthopedic unit.  On the postoperative day, the  surgical dressing was changed and Foley catheter was removed.  The  patient complained of nausea but no vomiting.  His hemoglobin was found  to be 12.2.  He was able to ambulate 75 feet with physical therapy.  On  the second postoperative day, the PCA Dilaudid pump was discontinued.  The patient reported resolution in his nausea and he was eating well.  His hemoglobin was 10.2.  On the third postoperative day, the patient  had climbed the stairs with physical therapy and was discharged to his  home in the morning.  He was eating well and ambulating independently.   DISPOSITION:  The patient was discharged home on May 22, 2008.  His  nearest home care was to manage his wound, Coumadin, and physical  therapy.  DISCHARGE MEDICATIONS:  As per HMR with the addition of:  Percocet 5 mg tablets 1-2 tablets p.o. q.4 hours p.r.n. pain.  Coumadin 5 mg tablets take as directed with a target INR of 1.5-2.   He was weightbearing as tolerated and was to return to the clinic in 1  week.   FINAL DIAGNOSES:  Left hip end-stage degenerative joint disease.      Shirl Harris, PA      Feliberto Gottron. Turner Daniels, M.D.  Electronically Signed    JW/MEDQ  D:  06/18/2008  T:  06/19/2008  Job:  [id number]

## 2011-03-23 NOTE — H&P (Signed)
[facility]  History and Physical Exam    Patient ID:  Brian Buckley  [id number]    64 y.o.  1946/08/17    Today: Mar 23, 2011    Vitals Signs: Reviewed as noted in medical record.    Allergies: No Known Allergies    CC: right hip pain    HPI:  Pt complains of right hip pain and with difficulty ambulating.     Relevant PMH:   Past Medical History   Diagnosis Date   ??? Hypertension    ??? Arthritis    ??? Nausea & vomiting    ??? Unspecified sleep apnea      does not use CPAP       Objective:                    HEENT: NC/AT                   Lungs:  clear                   Heart:   rrr                   Abdomen: soft                   Extremities:  Pain with rom of the right hip      Assessment: OA    Plan:  Proceed with scheduled right total hip revision.    Signed By: Claris Gower, MD  Mar 23, 2011

## 2011-03-23 NOTE — Other (Signed)
Opened chart to review prior to surgery.  Checks made to ensure chart/patient is cleared and ready for surgery.

## 2011-03-24 ENCOUNTER — Inpatient Hospital Stay
Admit: 2011-03-24 | Discharge: 2011-03-26 | Disposition: A | Discharge status: Home Health | Source: Ambulatory Visit | Attending: Orthopaedic Surgery | Admitting: Orthopaedic Surgery

## 2011-03-24 DIAGNOSIS — Z96649 Presence of unspecified artificial hip joint: Secondary | ICD-10-CM

## 2011-03-24 DIAGNOSIS — T84018A Broken internal joint prosthesis, other site, initial encounter: Secondary | ICD-10-CM

## 2011-03-24 LAB — TYPE & SCREEN
ABO/Rh(D): A NEG
Antibody screen: NEGATIVE

## 2011-03-24 LAB — GLUCOSE, POC: Glucose (POC): 97 mg/dL (ref 65–100)

## 2011-03-24 LAB — HEMOGLOBIN: HGB: 13.9 g/dL (ref 13.2–17.1)

## 2011-03-24 LAB — TYPE AND SCREEN
ABO/Rh: A NEG
Antibody Screen: NEGATIVE

## 2011-03-24 MED ORDER — SALINE PERIPHERAL FLUSH Q8H
Freq: Three times a day (TID) | INTRAMUSCULAR | Status: DC
Start: 2011-03-24 — End: 2011-03-26
  Administered 2011-03-24 – 2011-03-26 (×6)

## 2011-03-24 MED ORDER — LACTATED RINGERS BOLUS IV
Freq: Once | INTRAVENOUS | Status: AC
Start: 2011-03-24 — End: 2011-03-24
  Administered 2011-03-24: 14:00:00 via INTRAVENOUS

## 2011-03-24 MED ORDER — LACTATED RINGERS IV
INTRAVENOUS | Status: DC
Start: 2011-03-24 — End: 2011-03-24
  Administered 2011-03-24: 19:00:00 via INTRAVENOUS

## 2011-03-24 MED ORDER — DEXTROSE 5%-1/2 NORMAL SALINE IV
INTRAVENOUS | Status: AC
Start: 2011-03-24 — End: 2011-03-26
  Administered 2011-03-24 – 2011-03-25 (×2): via INTRAVENOUS

## 2011-03-24 MED ORDER — LOSARTAN 50 MG TAB
50 mg | Freq: Every day | ORAL | Status: DC
Start: 2011-03-24 — End: 2011-03-26
  Administered 2011-03-25: 16:00:00 via ORAL

## 2011-03-24 MED ORDER — SALINE PERIPHERAL FLUSH PRN
INTRAMUSCULAR | Status: DC | PRN
Start: 2011-03-24 — End: 2011-03-26

## 2011-03-24 MED ADMIN — promethazine (PHENERGAN) injection 25 mg: INTRAMUSCULAR | @ 23:00:00 | NDC 00641092825

## 2011-03-24 MED ADMIN — midazolam (VERSED) injection 2 mg: INTRAVENOUS | @ 16:00:00 | NDC 00641605701

## 2011-03-24 MED ADMIN — docusate sodium (COLACE) capsule 100 mg: ORAL | @ 22:00:00 | NDC 57664011208

## 2011-03-24 MED ADMIN — ceFAZolin (ANCEF) 2g IVPB in 100 ml 0.9% NS: INTRAVENOUS | NDC 99990004319

## 2011-03-24 MED ADMIN — famotidine (PEPCID) tablet 20 mg: ORAL | @ 14:00:00 | NDC 68084017211

## 2011-03-24 MED ADMIN — celecoxib (CELEBREX) capsule 200 mg: ORAL | @ 22:00:00 | NDC 82009006905

## 2011-03-24 MED ADMIN — lidocaine (XYLOCAINE) 10 mg/mL (1 %) injection 0.1 mL: SUBCUTANEOUS | @ 14:00:00 | NDC 00409427601

## 2011-03-24 MED ADMIN — lactated ringers infusion: INTRAVENOUS | @ 14:00:00 | NDC 11845118709

## 2011-03-24 MED ADMIN — morphine injection 8 mg: INTRAVENOUS | @ 22:00:00 | NDC 00409126069

## 2011-03-24 MED ADMIN — ceFAZolin (ANCEF) 2g IVPB in 100 ml 0.9% NS: INTRAVENOUS | @ 17:00:00 | NDC 99990004319

## 2011-03-24 NOTE — Other (Signed)
Attempted to call report to floor nurse, floor nurse unavailable due to waiting for another  PACU patient to arrive on floor.

## 2011-03-24 NOTE — Progress Notes (Signed)
TRANSFER - IN REPORT:    Verbal report received from [facility]) on Brian Buckley  being received from [employer]) for routine post - op      Report consisted of patient???s Situation, Background, Assessment and   Recommendations(SBAR).     Information from the following report(s) SBAR, Intake/Output, MAR, Accordion and Recent Results was reviewed with the receiving nurse.    Opportunity for questions and clarification was provided.      Assessment completed upon patient???s arrival to unit and care assumed.

## 2011-03-24 NOTE — Other (Signed)
TRANSFER - IN REPORT:    Verbal report received from Drinda Butts RN on Brian Buckley  being received from ortho for routine progression of care      Report consisted of patient???s Situation, Background, Assessment and   Recommendations(SBAR).     Information from the following report(s) Kardex, MAR and Recent Results was reviewed with the receiving nurse.    Opportunity for questions and clarification was provided.

## 2011-03-24 NOTE — Progress Notes (Signed)
Lying quietly with eyes closed.RR even and unlabored.No apparent distress noted.Plexipulse resumed. Family member at bedside.  Call light within reach.

## 2011-03-24 NOTE — Other (Signed)
Betadine lavage:  17.5cc of betadine lot # [id number] , exp. Date 10/2013 ,  in 500cc of .9NS Lot #  [id number], exp. Date :  12/2013

## 2011-03-24 NOTE — Brief Op Note (Signed)
BRIEF OPERATIVE NOTE    Date of Procedure: 03/24/2011   Preoperative Diagnosis: OA  Postoperative Diagnosis: OA Right hip    Procedure:  HIP ARTHROPLASTY TOTAL REVISION - DEPUY Right hip revision liner and head exchange  Surgeon(s) and Role:     * Claris Gower, MD - Primary  Anesthesia: Spinal   Estimated Blood Loss: 300cc  Specimens:   ID Type Source Tests Collected by Time Destination   1 : Opening Right Hip Wound Hip CULTURE, ANAEROBIC, CULTURE, WOUND, GRAM STAIN Claris Gower, MD 03/24/2011 1305 Microbiology      Findings: poly wear   Complications: none  Implants:   Implant Name Type Inv. Item Serial No. Manufacturer Lot No. LRB No. Used Action   LINER ACET +4 10D [id number] - [id number]  LINER ACET +4 10D 36X56 [id number] [employer] [id number] Right 1 Implanted   Locking Ring   [id number]  [id number] Right 1 Implanted   HEAD FEM +12MM NK - [id number]   HEAD FEM +12MM NK [id number] [employer] [id number] Right 1 Implanted     2

## 2011-03-24 NOTE — Progress Notes (Signed)
BP 152/80   Pulse 51   Temp 98.2 ??F (36.8 ??C)   Resp 16   Ht 5\' 10"  (1.778 m)   Wt 117.028 kg (258 lb)   BMI 37.02 kg/m2   SpO2 96%, pain well controlled, airway patent, patient appropriately hydrated and appears euvolemic, Alert and oriented,  no nausea,  follow up per surgeon, no anesthetic complications, Transfer to floor.  He had a long acting spinal that may last another 2-3 hours, but his vital signs are stable and WNL, his SAB can completely resolve on the floor.

## 2011-03-24 NOTE — Other (Signed)
TRANSFER - IN REPORT:    Verbal report received from Drinda Butts RN on Brian Buckley  being received from 3rd floor ortho for routine progression of care      Report consisted of patient???s Situation, Background, Assessment and   Recommendations(SBAR).     Information from the following report(s) Kardex and MAR was reviewed with the receiving nurse.    Opportunity for questions and clarification was provided.      Assessment completed upon patient???s arrival to unit and care assumed.

## 2011-03-24 NOTE — Other (Signed)
Wife called and updated on status and delay of transfer to floor due to nurse availability for patient care, patient spoke with wife on phone

## 2011-03-24 NOTE — Op Note (Signed)
[facility]                           [address]                           [place], [place]. 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    NAME OF PROCEDURE:  Revision of right hip arthroplasty and pinning.    SURGEON: Jacquelin Hawking, MD    ANESTHESIA:  Spinal.    SPECIMEN: None.    COMPLICATIONS: None.    BLOOD LOSS: 300 mL.    INDICATIONS FOR PROCEDURE: The patient is an unfortunate gentleman who  has a polyethelene wear and right hip discomfort. He desires surgical  revision.    OPERATIVE PROCEDURE: The patient was taken to the operating room where  adequate induction of spinal block, was prepped and draped in the usual  sterile fashion over the right hip. Two grams of Ancef were given preop  by IV.    The old incision was only utilized slightly at the distal area and  continued to allow posterior approach for this revision arthroplasty. The  previous incision was from an anterior approach.    Dissection was carried to the subcutaneous tissue into the fascia with  hemostasis obtained with Bovie cauterization. The external rotators were  then released and the capsule T'd and the head dislocated. There was  noted to be dramatic synovitis here. This area was cultured. It did not  appear infected.    Some synovial tissue was removed, the head then examined, a size 28,  incised and tamped from the field. The stem was noted to be well fixed   and staying at about 15 degrees anteversion. The stem was well fixed  radiographically and clinically.    The soft tissue was then mobilized in the anterior, superior aspect of  the acetabular region and the trunnions mobilized into this region. The  acetabular retractor placed. Another acetabular retractor placed  inferiorly and one posterior inferior.  Soft tissue was dissected around  the area. An osteotome and mallet were used to remove the worn  polyethelene liner. This was a 56 mm cup as identified off the liner.  When the liner was removed, so was a locking ring mechanism. All soft  tissue was removed from around the acetabular region. A trial reduction  with a 36 mm head accommodating the liner with a bit of a high wall and a  12 mm femoral head allowed equilibration and limb length with an  additional 5 mm obtained due to polyethelene wear and additional length  in the neck size. This was stable in extension with appropriate extension  and external rotation as well as flexion and internal rotation of the  dislocation. This was deemed appropriate and reasonable for its  reimplantation.    All chalk pellets were removed, began the soft tissue dissection of the  area. Locking ring wire was placed into the cup. The true 36 mm high wall  liner was then tamped into place with a high wall liner posterolateral.    The permanent 12 mm x 36 femoral head was then tamped on the trunnions,  noted to be secure. The hip was reduced and noted to be stable as was the  trial.    Additional copious irrigation ensued including Betadine soaking.    A #1 PDS was used to reapproximate the capsular tissue and a #5 Ethibond  used to reapproximate the external rotators through drill holes in the  greater trochanter.    A #1 double-stranded PDS was used to reapproximate the fascia and adipose  layer as well as 2-0 Monocryl subcutaneous layers and staples used to  close the skin.     Placed in a sterile dressing awaiting transfer to recovery at the time of  dictation.                Jacquelin Hawking, MD    A                This is an unverified document unless signed by physician.    TID:  [id number]                                      DT:  03/24/2011  4:01 P  JOB:  [id number]        DOC#:  [id number]           DD:  03/24/2011    cc:   Jacquelin Hawking, MD        Phys Other

## 2011-03-24 NOTE — Other (Signed)
TRANSFER - OUT REPORT:    Verbal report given to Orson Gear RN(name) on Brian Buckley  being transferred to [facility](unit) for routine post - op       Report consisted of patient???s Situation, Background, Assessment and   Recommendations(SBAR).     Information from the following report(s) OR Summary, Intake/Output and MAR was reviewed with the receiving nurse.    Opportunity for questions and clarification was provided.

## 2011-03-24 NOTE — Other (Signed)
Radiology at bedside for pelvis xray

## 2011-03-24 NOTE — Other (Signed)
TRANSFER - OUT REPORT:    Verbal report given to Rachael B. on Brian Buckley  being transferred to preop holding for routine progression of care       Report consisted of patient???s Situation, Background, Assessment and   Recommendations(SBAR).     Information from the following report(s) SBAR, MAR and Recent Results was reviewed with the receiving nurse.    Opportunity for questions and clarification was provided.

## 2011-03-24 NOTE — Op Note (Signed)
[facility]                           [address]                           [place], [place]. 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    NAME OF PROCEDURE:  Revision of right hip arthroplasty and pinning.    SURGEON: Jacquelin Hawking, MD    ANESTHESIA:  Spinal.    SPECIMEN: None.    COMPLICATIONS: None.    BLOOD LOSS: 300 mL.    INDICATIONS FOR PROCEDURE: The patient is an unfortunate gentleman who  has a polyethelene wear and right hip discomfort. He desires surgical  revision.    OPERATIVE PROCEDURE: The patient was taken to the operating room where  adequate induction of spinal block, was prepped and draped in the usual  sterile fashion over the right hip. Two grams of Ancef were given preop  by IV.    The old incision was only utilized slightly at the distal area and  continued to allow posterior approach for this revision arthroplasty. The  previous incision was from an anterior approach.    Dissection was carried to the subcutaneous tissue into the fascia with  hemostasis obtained with Bovie cauterization. The external rotators were  then released and the capsule T'd and the head dislocated. There was  noted to be dramatic synovitis here. This area was cultured. It did not  appear infected.    Some synovial tissue was removed, the head then examined, a size 28,  incised and tamped from the field. The stem was noted to be well fixed  and staying at about  15 degrees anteversion. The stem was well fixed  radiographically and clinically.    The soft tissue was then mobilized in the anterior, superior aspect of  the acetabular region and the trunnions mobilized into this region. The  acetabular retractor placed. Another acetabular retractor placed  inferiorly and one posterior inferior.  Soft tissue was dissected around  the area. An osteotome and mallet were used to remove the worn  polyethelene liner. This was a 56 mm cup as identified off the liner.  When the liner was removed, so was a locking ring mechanism. All soft  tissue was removed from around the acetabular region. A trial reduction  with a 36 mm head accommodating the liner with a bit of a high wall and a  12 mm femoral head allowed equilibration and limb length with an  additional 5 mm obtained due to polyethelene wear and additional length  in the neck size. This was stable in extension with appropriate extension  and external rotation as well as flexion and internal rotation of the  dislocation. This was deemed appropriate and reasonable for its  reimplantation.    All chalk pellets were removed, began the soft tissue dissection of the  area. Locking ring wire was placed into the cup. The true 36 mm high wall  liner was then tamped into place with a high wall liner posterolateral.    The permanent 12 mm x 36 femoral head was then tamped on the trunnions,  noted to be secure. The hip was reduced and noted to be stable as was the  trial.    Additional copious irrigation ensued including Betadine soaking.    A #1 PDS was used to reapproximate the capsular tissue and a #5 Ethibond  used to reapproximate the external rotators through drill holes in the  greater trochanter.    A #1 double-stranded PDS was used to reapproximate the fascia and adipose  layer as well as 2-0 Monocryl subcutaneous layers and staples used to  close the skin.    Placed in a sterile dressing awaiting transfer to recovery at the time  of  dictation.                Jacquelin Hawking, MD    A                This is an unverified document unless signed by physician.    TID:  [id number]                                      DT:  03/24/2011  4:01 P  JOB:  [id number]        DOC#:  [id number]           DD:  03/24/2011    cc:   Jacquelin Hawking, MD        Phys Other

## 2011-03-25 LAB — HEMOGLOBIN: HGB: 12.2 g/dL — ABNORMAL LOW (ref 13.2–17.1)

## 2011-03-25 LAB — GRAM STAIN: GRAM STAIN: 0

## 2011-03-25 MED ORDER — ASPIRIN 81 MG TAB, DELAYED RELEASE
81 mg | ORAL_TABLET | Freq: Two times a day (BID) | ORAL | Status: AC
Start: 2011-03-25 — End: ?

## 2011-03-25 MED ORDER — OXYCODONE 10 MG TAB
10 mg | ORAL_TABLET | ORAL | Status: DC | PRN
Start: 2011-03-25 — End: 2017-10-31

## 2011-03-25 MED ADMIN — enoxaparin (LOVENOX) injection 40 mg: SUBCUTANEOUS | @ 13:00:00 | NDC 00075062040

## 2011-03-25 MED ADMIN — promethazine (PHENERGAN) tablet 25 mg: ORAL | @ 13:00:00 | NDC 68382004101

## 2011-03-25 MED ADMIN — ceFAZolin (ANCEF) 2g IVPB in 100 ml 0.9% NS: INTRAVENOUS | @ 16:00:00 | NDC 99990004319

## 2011-03-25 MED ADMIN — oxyCODONE (ROXICODONE) tablet 10 mg: ORAL | @ 23:00:00 | NDC 68084035411

## 2011-03-25 MED ADMIN — ceFAZolin (ANCEF) 2g IVPB in 100 ml 0.9% NS: INTRAVENOUS | @ 01:00:00 | NDC 44567084025

## 2011-03-25 MED ADMIN — oxyCODONE (ROXICODONE) tablet 10 mg: ORAL | @ 05:00:00 | NDC 68084035411

## 2011-03-25 MED ADMIN — aspirin delayed-release tablet 81 mg: ORAL | @ 02:00:00 | NDC 63739027210

## 2011-03-25 MED ADMIN — promethazine (PHENERGAN) tablet 25 mg: ORAL | @ 05:00:00 | NDC 68382004101

## 2011-03-25 MED ADMIN — metoprolol-XL (TOPROL-XL) tablet 100 mg: ORAL | @ 17:00:00 | NDC 58177036809

## 2011-03-25 MED ADMIN — ceFAZolin (ANCEF) 2g IVPB in 100 ml 0.9% NS: INTRAVENOUS | @ 08:00:00 | NDC 99990004319

## 2011-03-25 MED ADMIN — aspirin delayed-release tablet 81 mg: ORAL | @ 13:00:00 | NDC 63739027210

## 2011-03-25 MED ADMIN — celecoxib (CELEBREX) capsule 200 mg: ORAL | @ 13:00:00 | NDC 00025152551

## 2011-03-25 MED ADMIN — oxyCODONE (ROXICODONE) tablet 10 mg: ORAL | @ 13:00:00 | NDC 68084035411

## 2011-03-25 MED ADMIN — docusate sodium (COLACE) capsule 100 mg: ORAL | @ 22:00:00 | NDC 57664011208

## 2011-03-25 MED ADMIN — oxyCODONE (ROXICODONE) tablet 10 mg: ORAL | @ 17:00:00 | NDC 00406055262

## 2011-03-25 MED ADMIN — docusate sodium (COLACE) capsule 100 mg: ORAL | @ 13:00:00 | NDC 62584068311

## 2011-03-25 MED ADMIN — tuberculin injection 5 Units: INTRADERMAL | @ 13:00:00 | NDC 99990003564

## 2011-03-25 MED ADMIN — celecoxib (CELEBREX) capsule 200 mg: ORAL | @ 22:00:00 | NDC 82009006905

## 2011-03-25 NOTE — Other (Signed)
Right hip dressing changed per sterile technique. Incision approximated without active bleeding or drainage. Staples intact. No unusual bruising or swelling noted. No blisters noted. Dressed with sterile 4x4 gauze and tegaderm. Tolerated procedure well. Moves well in the bed.

## 2011-03-25 NOTE — Progress Notes (Signed)
Pharmacy Note:    Spoke with pt on rounds.  Pt currently has no complaints.  Pt had some nausea yesterday but states that it is better today.    Thanks,  Violeta Gelinas, PharmD

## 2011-03-25 NOTE — Progress Notes (Signed)
Slept rest of shift without further c/o.N/V status remains WDL. Denies needs at present.   Family member at bedside. Call light within reach.

## 2011-03-25 NOTE — Progress Notes (Signed)
Problem: Mobility Impaired (Adult and Pediatric)  Goal: *Acute Goals and Plan of Care (Insert Text)  SHORT TERM GOALS:  (1.)Mr. Epps will move from supine to sit and sit to supine in bed with CONTACT GUARD ASSIST within 1-3 days.   (2.)Mr. Vore will transfer from bed to chair and chair to bed with STAND BY ASSIST using the least restrictive device within 1-3 days.   (3.)Mr. Kemppainen will ambulate with STAND BY ASSIST for 100 feet with the least restrictive device within 1-3 days.     LONG TERM GOALS:  (1.)Mr. Lutes will move from supine to sit and sit to supine in bed with STAND BY ASSIST within 4-6 days.   (2.)Mr. Osment will transfer from bed to chair and chair to bed with SUPERVISION using the least restrictive device within 4-6 days.   (3.)Mr. Rayburn will ambulate with SUPERVISION for 250 feet with the least restrictive device within 4-6 days.   (4.)Mr. Rathman will ambulate up/down 3 steps with bilateral railing with SUPERVISION with no device within 4-6 days.  (5.)Mr. Maclellan will state/observe THA precautions with 0 verbal cues within 4-6 days.  ________________________________________________________________________________________________     ACUTE PHYSICAL THERAPY JOINT CAMP DAILY NOTE  1 Day Post-Op        Primary Diagnosis: OA              Procedure(s) and Anesthesia Type:     * HIP ARTHROPLASTY TOTAL REVISION - Spinal (Right)     Interdisciplinary Collaboration: Physical Therapist  SUBJECTIVE:  Patient stated ???I would like to stay up in the chair???.  OBJECTIVE:    Pain Intensity 1:  (no complaint of pain during evaluation)                   Right Side Weight Bearing: As tolerated  Gait Training  Assistive Device: Walker, rolling  Ambulation - Level of Assistance: SBA  Distance (ft): 230 Feet (ft)  Duration: 15 Minutes  Gait Abnormalities: Antalgic   Surface:level tile    Braces/Orthotics: none    Bed Mobility  Supine to Sit:  (up in chair)  Sit to Supine:  (left up in chair)   Scooting: Additional time     Transfers  Sit to Stand: CGA  Stand to Sit: CGA  Bed to Chair: CGA     Balance  Sitting: Intact  Standing: Pull to stand;With support       Posture  Posture (WDL): Within defined limits                     Right Hip Cold  Type: Cold/ice packs      EXERCISES AM  PM Active Active Assist Passive Comments   GROUP THERAPY [ ]  [ ]            Ankle Pumps   15 [X]  [ ]  [ ]      Quad Sets   15 [X]  [ ]  [ ]      Gluteal Sets   15 [X]  [ ]  [ ]      Hip Abd/Adduction   15 [ ]  [X]  [ ]      Straight Leg Raises     [ ]  [ ]  [ ]      Heel Slides   15 [X]  [ ]  [ ]      Short Arc Quads     [ ]  [ ]  [ ]      Long Arc Quads   15 [X]  [ ]  [ ]   Chair Slides     [ ]  [ ]  [ ]            [ ]  [ ]  [ ]        Treatment Times:              Therapeutic Exercise: 15 Minutes              Gait Training: 15 Minutes              Therapeutic Activity:                 Neuromuscular Re-education:    Education:  [X] Home Exercises         [ ] Going Home Video       [X] Hip Precautions         [X] Fall Precautions          [ ] Knee/Hip Prosthesis Review  [X] Walker Management/Safety   [ ] Other:   Safety:    After treatment precautions: [ ] Bed            [ ] Rails Up        [X] Chair     [X] Essentials within Reach    [ ] Restraint in place      [X] Caregiver present  [X] RN notified       [ ] Bed Alarm/Tab Alert applied      ASSESSMENT:    ?? Patient???s response to today???s session was: tolerated well with no complications.   ?? Patient is demonstrating: significant progress towards goal(s).   ?? Compliance with program/exercises: compliant all of the time.   ?? Recommended level of rehabilitation at time of discharge (pending progress): Home Health         ?? Other Comments/Recommendations/DME: no needs at this time.               Plan/Intent for next treatment: Physical therapy for bed mobility, transfers, gait training, strength/ROM exercises, modalities for pain, and patient education.              Continue to follow patient twice daily for  duration of hospital stay to address goals per initial evaluation.  PT Patient Time In/Time Out  Time In: 0130  Time Out: 1330  Glory Buff, PT

## 2011-03-25 NOTE — Other (Signed)
NAC.  Tolerated anesthesia and procedure well.

## 2011-03-25 NOTE — Progress Notes (Signed)
March 25, 2011         Post Op day: 1 Day Post-Op     Admit Date: 03/24/2011  Admit Diagnosis: OA  OA        Subjective: Doing well, No complaints, No SOB, No Chest Pain, No Nausea or Vomiting     Objective:   Vital Signs are Stable, No Acute Distress, Alert and Oriented, Dressing is Dry,  Neurovascular exam is normal.     Assessment / Plan :  Patient Active Problem List   Diagnoses Code   ??? 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    Continue PT  Lab Results   Component Value Date/Time    HGB 12.2 03/25/2011  4:44 AM    Monitor HGB   Pt seen by and discussed with Supervising Physician      Signed By: Ines Bloomer, PA

## 2011-03-25 NOTE — Progress Notes (Signed)
Problem: Interdisciplinary Rounds  Goal: Interdisciplinary Rounds  Outcome: Progressing Towards Goal  Interdisciplinary team rounds were held 03/25/2011 with the following team members:Care Management and Nursing and the patient.  Plan of Care options were discussed with the team and the patient.  The patient would benefit from a screening and/or visit from [facility].    [facility] [phone]

## 2011-03-25 NOTE — Progress Notes (Signed)
Pt denies need for pain medication at this time.

## 2011-03-25 NOTE — Progress Notes (Signed)
Problem: Self Care Deficits Care Plan (Adult)  Goal: *Acute Goals and Plan of Care (Insert Text)  SHORT TERM GOALS:  1. Patient will perform lower body bathing/dressing with contact guard assist with adaptive equipment within 1-3 day(s).   2. Patient will perform toileting / toilet transfer with contact guard assist with adaptive equipment within 1-3 day(s).   3. Patient will perform shower transfer with contact guard assist with adaptive equipment within 1-3 day(s).  4. Patient will state/observe 3/3 THA precautions with 2 verbal cues within 3 day(s).    LONG TERM GOALS:   1. Patient will perform lower body bathing/dressing with supervision with adaptive equipment within 4-7 day(s).   2. Patient will perform toileting / toilet transfer with supervision/set-up with adaptive equipment within 4-7 day(s).   3. Patient will perform shower transfer with supervision/set-up with adaptive equipment within 4-7 day(s).        OCCUPATIONAL THERAPY JOINT CAMP HIP ASSESSMENT  1 Day Post-Op  [X]            Initial Assessment               [ ]            7th Visit                      [ ]            Discharge  NAME/AGE/GENDER: Brian Buckley is a 65 y.o. male  DATE: 03/25/2011  PRIMARY DIAGNOSIS:Prosthetic hip implant failure   Past Medical History   Diagnosis Date   ??? Hypertension     ??? Arthritis     ??? Nausea & vomiting     ??? Unspecified sleep apnea         does not use CPAP     Past Surgical History   Procedure Date   ??? Hx other surgical 1965       pilonial cyst   ??? Total hip arthroplasty 1995, 2009       right, left   ??? Hx orthopaedic         right big toe fusion   ??? Total hip arthroplasty 11/11       revision, left     Patient Active Problem List   Diagnoses Code   ??? 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   OBJECTIVE: Reviewed all THA precautions with pt. Reviewed therapy schedule for day one and two following surgery including plan for ADL's.   Pain Intensity 1:  (no complaint of pain during evaluation)        Bed Mobility  Supine to Sit: Minimal assistance  Sit to Supine:  (pt left up in chair)  Scooting: Additional timeFunctional Transfers  Sit to Stand: Minimum assistance  Stand to Sit: CGA  Bed to Chair: CGA  Toilet Transfer : Minimum assistance  Shower Transfer: Minimum assistanceBalance  Sitting: Intact  Standing: Pull to stand;With support  BRACES/ORTHOTICS: N/A  Gross Assessment  AROM: Within functional limits (limited  R LE)  Strength: Within functional limits (limited R LE)  Sensation: Intact  RUE Assessment  RUE Assessment (WDL): Within defined limits          RUE Strength, Tone, Sensation  RUE Strength, Tone, Sensation (WDL): Within defined limits                         LUE Assessment  LUE Assessment (WDL): Within defined limits          LUE Strength, Tone, Sensation  LUE Strength, Tone, Sensation (WDL): Within defined limits                            Mental Status  Neurologic State: Alert  Orientation Level: Oriented X4  Cognition: Appropriate decision making  Perception: Appears intact  Perseveration: No perseveration noted  Safety/Judgement: Awareness of environment    ADLs from General Assessment:  Basic ADL   Feeding: Supervision/set up (03/25/11 1320)  Oral Facial Hygiene/Grooming: Supervision/set up (seated) (03/25/11 1320)  Bathing: Minimum assistance (03/25/11 1320)  Upper Body Dressing: Supervision/set up (03/25/11 1320)  Lower Body Dressing: Moderate assistance (03/25/11 1320)  Toileting: Minimum assistance (03/25/11 1320)             Treatment Times:Initial Evaluation    Safety:    After treatment precautions: [ ]           Bed            [ ]           Rails Up        [X]           Chair     [X]           Essentials within Reach    [ ]           Restraint in place       [ ]           Caregiver present        [ ]           RN notified         [ ]           Bed AlarmTab Alert applied  Pt was left in chair with caregiver in room.  Nrsg aware that pt is in chair and pt educated on need to get assistance if moving out of chair.    ASSESSMENT:  PROBLEM LIST:  1. Decreased independence with self-care  2. Decreased independence with mobility in ADL's  3. Decreased knowledge of hip precautions    PLAN OF CAREPt requires skilled OT to maximize independence with self care tasks, functional transfers and safety precautions.  INTERVENTIONS PLANNED:  1. Self care training  2. Therapeutic ex/activity  3. Pt education  4.Functional mobility in ADL's    Frequency/Duration:  Continue to follow patient 3 times a week for until goals met to address goals as stated above.    REHABILITATION POTENTIAL FOR STATED GOALS:   Good   Benefits and precautions of occupational therapy have been discussed with the patient.     ?? Patient???s response to today???s session was: tolerated well with no complications   ?? Compliance with program/exercises: YES   ?? Recommended level of rehabilitation at time of discharge (pending progress): Home Health   ?? Other Comments/Recommendations/DME: RW     OT Patient Time In/Time Out  Time In: 1030  Time Out: 1100  Thank you for  this referral,  Elmer Bales, OT

## 2011-03-25 NOTE — Progress Notes (Signed)
Oxycodone 10 mg po given with Phenergan 12.5 mg for c/o pain.

## 2011-03-25 NOTE — Progress Notes (Signed)
Problem: Mobility Impaired (Adult and Pediatric)  Goal: *Acute Goals and Plan of Care (Insert Text)  SHORT TERM GOALS:  (1.)Mr. Riera will move from supine to sit and sit to supine in bed with CONTACT GUARD ASSIST within 1-3 days.   (2.)Mr. Grayson will transfer from bed to chair and chair to bed with STAND BY ASSIST using the least restrictive device within 1-3 days.   (3.)Mr. Naclerio will ambulate with STAND BY ASSIST for 100 feet with the least restrictive device within 1-3 days.     LONG TERM GOALS:  (1.)Mr. Faughn will move from supine to sit and sit to supine in bed with STAND BY ASSIST within 4-6 days.   (2.)Mr. Carrozza will transfer from bed to chair and chair to bed with SUPERVISION using the least restrictive device within 4-6 days.   (3.)Mr. Stirewalt will ambulate with SUPERVISION for 250 feet with the least restrictive device within 4-6 days.   (4.)Mr. Gibbon will ambulate up/down 3 steps with bilateral railing with SUPERVISION with no device within 4-6 days.  (5.)Mr. Summons will state/observe THA precautions with 0 verbal cues within 4-6 days.  ________________________________________________________________________________________________     ACUTE PHYSICAL THERAPY JOINT CAMP THA ASSESSMENT NOTE  [X]   Initial/Completed Assessment     [ ]   7th Visit         [ ]   Discharge  NAME/AGE/GENDER: Sollie Vultaggio is a 65 y.o. male  DATE: 03/25/2011  Primary Diagnosis: OA              Procedure(s) and Anesthesia Type:     * HIP ARTHROPLASTY TOTAL REVISION - Spinal (Right)      Past Medical History   Diagnosis Date   ??? Hypertension     ??? Arthritis     ??? Nausea & vomiting     ??? Unspecified sleep apnea         does not use CPAP      Past Surgical History   Procedure Date   ??? Hx other surgical 1965       pilonial cyst   ??? Total hip arthroplasty 1995, 2009       right, left   ??? Hx orthopaedic         right big toe fusion   ??? Total hip arthroplasty 11/11       revision, left       Patient Active Problem List   Diagnoses Code   ??? 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  Patient has had multiple hip replacements.  Pt. Was working prior to admit.  Home Situation  Home Environment: Private residence  # Steps to Enter: 5   One/Two Story Residence: One story  Living Alone: No  Support Systems: Spouse  Patient Expects to be Discharged to:: Private residence  Current DME Used/Available at Home: Walker, rolling;Cane, straight;Shower chair    Interdisciplinary Collaboration: Physical Therapist, Acupuncturist and Certified Nursing Assistant/Patient Care Technician  SUBJECTIVE:  Patient stated ???I would like to put my shorts on if I can???.  OBJECTIVE:    Pain Intensity 1: 2                   Gross Assessment  AROM: Within functional limits (limited R LE)  Strength: Within functional limits (limited R LE)  Sensation: Intact  Bed Mobility  Supine to Sit: Minimal assistance  Sit to Supine:  (patient left up in chair)  Scooting: Additional time    Transfers  Sit to Stand: Minimum assistance  Stand to Sit: CGA  Bed to Chair: CGA    Balance  Sitting: Intact  Standing: Pull to stand       Posture  Posture (WDL): Within defined limits         Gait Training  Assistive Device: Walker, rolling  Ambulation - Level of Assistance: CGA;SBA  Distance (ft): 20 Feet (ft)  Gait Abnormalities: Antalgic   Surface:level tile     Braces/Orthotics: none    Right Hip Cold  Type: Cold/ice packs     EXERCISES AM  PM Active Active Assist Passive Comments   GROUP THERAPY [ ]    [ ]              Ankle Pumps 10   [X]    [ ]    [ ]        Quad Sets 10   [X]    [ ]    [ ]        Gluteal Sets 10   [X]    [ ]    [ ]        Hip Abd/Adduction 10   [ ]    [X]    [ ]        Straight Leg Raises     [ ]    [ ]    [ ]        Knee Slides 10   [X]    [ ]    [ ]         Short Arc Quads     [ ]    [ ]    [ ]        Long Arc Quads 10   [X]    [ ]    [ ]        Chair Slides     [ ]    [ ]    [ ]              [ ]    [ ]    [ ]          Treatment Times:              Initial Evaluation: 20 minutes              Therapeutic Exercise: 10 Minutes              Gait Training:                Therapeutic Activity:                 Neuromuscular Re-education:      Education:  [X]   Home Exercises    [ ]   Going Home Video              [X]   Hip Precautions    [X]   Fall Precautions     [ ]   Knee/Hip Prosthesis Review  [X]   Walker Management/Safety   [ ]   Other:     Safety:    After treatment precautions: [ ]   Bed            [ ]   Rails Up        [X]   Chair     [X]   Essentials within Reach    [ ]   Restraint in place      [X]   Caregiver present  [X]   RN notified              [ ]   Bed Alarm/Tab Alert applied  ASSESSMENT:  Patient would benefit from skilled Physical Therapy intervention to maximize independence with functional mobility.   PROBLEM LIST:  1. Decreased Independence with Bed Mobility   2. Decreased Independence with Transfers   3. Decreased Independence with Ambulation   4. Decreased Independence with Stair Climbing   5. Decreased Hip ROM and Strength     INTERVENTIONS PLANNED:  1. Bed Mobility Training   2. Transfer Training   3. Gait Training   4. Stair Training   5. Therapeutic Exercises   6. Modalities for Pain     PLAN: Continue to follow patient twice daily for  duration of hospital stay to address goals per initial evaluation.    REHABILITATION POTENTIAL FOR STATED GOALS:  Excellent                   ?? Patient???s response to todays session was: tolerated well with no complications.   ?? Compliance with program/exercises: compliant all of the time.   ?? Recommended level of rehabilitation at time of discharge (pending progress): Home Health         ?? Other Comments/Recommendations/DME: has all his needed DME already.     Plan/Intent for next treatment: Physical therapy for bed mobility, transfers, gait training, strength/ROM exercises, modalities for pain, and patient education.   PT Patient Time In/Time Out  Time In: 1030  Time Out: 1100  MARY Zorita Pang, PT

## 2011-03-25 NOTE — Progress Notes (Signed)
Sitting up in bed talking with visitor. Pleasant. Notified of plan of care and discussed prn pain meds.

## 2011-03-26 LAB — HEMOGLOBIN: HGB: 11.7 g/dL — ABNORMAL LOW (ref 13.2–17.1)

## 2011-03-26 LAB — CULTURE, WOUND W GRAM STAIN
Culture result:: NO GROWTH
GRAM STAIN: 0

## 2011-03-26 MED ADMIN — ceFAZolin (ANCEF) 2g IVPB in 100 ml 0.9% NS: INTRAVENOUS | @ 08:00:00 | NDC 99990004319

## 2011-03-26 MED ADMIN — ceFAZolin (ANCEF) 2g IVPB in 100 ml 0.9% NS: INTRAVENOUS | @ 02:00:00 | NDC 99990004319

## 2011-03-26 MED ADMIN — enoxaparin (LOVENOX) injection 40 mg: SUBCUTANEOUS | @ 13:00:00 | NDC 00075062040

## 2011-03-26 MED ADMIN — acetaminophen (TYLENOL) tablet 500 mg: ORAL | @ 04:00:00 | NDC 51645070610

## 2011-03-26 MED ADMIN — docusate sodium (COLACE) capsule 100 mg: ORAL | @ 13:00:00 | NDC 62584068311

## 2011-03-26 MED ADMIN — aspirin delayed-release tablet 81 mg: ORAL | @ 02:00:00 | NDC 63739027210

## 2011-03-26 MED ADMIN — celecoxib (CELEBREX) capsule 200 mg: ORAL | @ 13:00:00 | NDC 00025152551

## 2011-03-26 MED ADMIN — oxyCODONE (ROXICODONE) tablet 10 mg: ORAL | @ 03:00:00 | NDC 68084035411

## 2011-03-26 MED ADMIN — aspirin delayed-release tablet 81 mg: ORAL | @ 13:00:00 | NDC 63739027210

## 2011-03-26 NOTE — Progress Notes (Signed)
I have reviewed discharge instructions with the patient.  The patient verbalized understanding.  Discharged home via wheelchair with staff.  Belongings and discharge instructions taken with patient.

## 2011-03-26 NOTE — Progress Notes (Signed)
Problem: Self Care Deficits Care Plan (Adult)  Goal: *Acute Goals and Plan of Care (Insert Text)  SHORT TERM GOALS:  1. Patient will perform lower body bathing/dressing with contact guard assist with adaptive equipment within 1-3 day(s).   2. Patient will perform toileting / toilet transfer with contact guard assist with adaptive equipment within 1-3 day(s).   3. Patient will perform shower transfer with contact guard assist with adaptive equipment within 1-3 day(s). Goal met 03/26/2011  4. Patient will state/observe 3/3 THA precautions with 2 verbal cues within 3 day(s). Goal met 03/26/11    LONG TERM GOALS:   1. Patient will perform lower body bathing/dressing with supervision with adaptive equipment within 4-7 day(s).   2. Patient will perform toileting / toilet transfer with supervision/set-up with adaptive equipment within 4-7 day(s). Goal met 03/26/2011  3. Patient will perform shower transfer with supervision/set-up with adaptive equipment within 4-7 day(s). Goal met 03/26/2011      OCCUPATIONAL THERAPY JOINT CAMP DAILY NOTE  2 Days Post-Op    Primary Diagnosis: OA              Procedure(s) and Anesthesia Type:     * HIP ARTHROPLASTY TOTAL REVISION - Spinal (Right)    Interdisciplinary Collaboration: Registered Nurse  SUBJECTIVE:  Patient stated ???Good morning???.  OBJECTIVE:   Pain Intensity 1: 0  Pain Location 1: Hip  Pain Orientation 1: Right  Pain Intervention(s) 1: Medication (see  PTA Meds/MAR)        Transfer Training  Sit to Stand: Supervision  Bed to Chair: Supervision    Mental Status  Neurologic State: Alert  Orientation Level: Oriented X4  Cognition: Appropriate decision making;Appropriate for age attention/concentration  Perception: Appears intact  Perseveration: No perseveration noted  Safety/Judgement: Awareness of environment    ADLs from ADL Navigator:  Grooming/Bathing/Dressing Activities of Daily Living     Cognitive Retraining   Safety/Judgement: Awareness of environment   Upper Body Bathing    Bathing Assistance: Modified independent  Position Performed: Standing  Adaptive Equipment: Grab bar     Lower Body Bathing   Bathing Assistance: Modified independent  Perineal  : Independent  Position Performed: Standing  Lower Body : Modified independent  Position Performed: Standing  Adaptive Equipment: Long handled sponge;Grab bar     Upper Body Dressing Assistance   Dressing Assistance: Independent  Pullover Shirt: Independent Functional Transfers   Bathroom Mobility: Supervision/set up  Shower Transfer: Supervision/set up  Adaptive Equipment: Grab bars   Lower Body Dressing Assistance   Dressing Assistance: Minimum assistance  Underpants: Minimum assistance  Pants With Elastic Waist: Minimum assistance  Socks: Minimum assistance  Antiembolitic Stockings: Compensatory technique training  Cues: Don;Physical assistance;Doff  Adaptive Equipment Used: Sock aid;Reacher;Long handled shoe horn Bed/Mat Mobility   Supine to Sit: Supervision  Sit to Supine: Supervision (No value, pt left in chair)  Sit to Stand: Supervision  Bed to Chair: Supervision     Treatment Times:              Therapeutic Exercise: 0 minutes              Self Care Training: 10 minutes              Therapeutic Activity: 15 minutes              Neuromuscular Re-education: 0 minutes              Other: 0 minutes    Safety:    After  treatment precautions: [ ]     Bed            [ ]     Rails Up        [X]     Chair     [X]     Essentials within Reach    [ ]     Restraint in place      [X]     Caregiver present  [X]     RN notified            [ ]     Bed Alarm/Tab Alert applied    ASSESSMENT:      ?? Patient???s response to today???s session was: tolerated well with no complications.   ?? Patient is demonstrating: minimal progress towards goal(s).   ?? Compliance with program/exercises: compliant all of the time.   ?? Recommended level of rehabilitation at time of discharge (pending progress): Home Health          ?? Other Comments/Recommendations/DME: Walkers, Type: Rolling Walker  Plan/Intent for next treatment:  Continue to follow patient daily for  duration of hospital stay to address goals per initial evaluation.  Time in: 0935  Time out: 1000         Sharin Grave, [address]

## 2011-03-26 NOTE — Progress Notes (Addendum)
Problem: Mobility Impaired (Adult and Pediatric)  Goal: *Acute Goals and Plan of Care (Insert Text)  SHORT TERM GOALS:  (1.)Mr. Sayres will move from supine to sit and sit to supine in bed with CONTACT GUARD ASSIST within 1-3 days.   (2.)Mr. Deshler will transfer from bed to chair and chair to bed with STAND BY ASSIST using the least restrictive device within 1-3 days. GOAL MET 03/26/2011  (3.)Mr. Knechtel will ambulate with STAND BY ASSIST for 100 feet with the least restrictive device within 1-3 days. GOAL MET 03/26/2011    LONG TERM GOALS:  (1.)Mr. Piercefield will move from supine to sit and sit to supine in bed with STAND BY ASSIST within 4-6 days.   (2.)Mr. Sherk will transfer from bed to chair and chair to bed with SUPERVISION using the least restrictive device within 4-6 days. GOAL MET 03/26/2011  (3.)Mr. Deman will ambulate with SUPERVISION for 250 feet with the least restrictive device within 4-6 days. GOAL MET 03/26/2011  (4.)Mr. Shipper will ambulate up/down 3 steps with bilateral railing with SUPERVISION with no device within 4-6 days. Declined stairs.  (5.)Mr. Winegarden will state/observe THA precautions with 0 verbal cues within 4-6 days. GOAL MET 03/26/2011  ________________________________________________________________________________________________   Outcome: Progressing Towards Goal    ACUTE PHYSICAL THERAPY JOINT CAMP DAILY NOTE  2 Days Post-Op        Primary Diagnosis: OA              Procedure(s) and Anesthesia Type:     * HIP ARTHROPLASTY TOTAL REVISION - Spinal (Right)     Interdisciplinary Collaboration: Registered Nurse and Rehabilitation Attendant  SUBJECTIVE:  Patient stated ???I'm going home!???.  OBJECTIVE:    Pain Intensity 1: 0                   Right Side Weight Bearing: As tolerated  Gait Training  Assistive Device: Walker, rolling  Ambulation - Level of Assistance: Supervision/Set-up  Distance (ft): 320 Feet (ft)  Duration: 15 Minutes   Surface:level tile     Braces/Orthotics: n/a    Bed Mobility  Supine to Sit: Supervision  Sit to Supine: Supervision (No value, pt left in chair)     Transfers  Sit to Stand: Supervision  Stand to Sit: Supervision  Bed to Chair: Supervision     Balance  Sitting: Intact  Standing: Intact                             Right Hip Cold  Type: Cold/ice packs      EXERCISES AM  PM Active Active Assist Passive Comments   GROUP THERAPY [X]  [ ]            Ankle Pumps 20   [X]  [ ]  [ ]      Quad Sets 20   [X]  [ ]  [ ]      Gluteal Sets 20   [X]  [ ]  [ ]      Hip Abd/Adduction 20   [X]  [ ]  [ ]      Straight Leg Raises 20   [X]  [ ]  [ ]      Heel Slides 20   [X]  [ ]  [ ]      Short Arc Quads 20   [X]  [ ]  [ ]      Long Arc Quads     [ ]  [ ]  [ ]      Chair Slides 20   [X]  [ ]  [ ]            [ ]  [ ]  [ ]   Treatment Times:              Therapeutic Exercise: 45 Minutes (group)              Gait Training: 15 Minutes              Therapeutic Activity:                 Neuromuscular Re-education:    Education:  [X] Home Exercises         [ ] Going Home Video       [ ] Hip Precautions          [ ] Fall Precautions           [X] Knee/Hip Prosthesis Review  [X] Walker Management/Safety   [ ] Other:   Safety:    After treatment precautions: [ ] Bed            [ ] Rails Up        [X] Chair     [X] Essentials within Reach    [ ] Restraint in place      [X] Caregiver present  [ ] RN notified       [ ] Bed Alarm/Tab Alert applied      ASSESSMENT:    ?? Patient???s response to today???s session was: tolerated well with no complications.   ?? Patient is demonstrating: moderate progress towards goal(s).   ?? Compliance with program/exercises: compliant most of the time.   ?? Recommended level of rehabilitation at time of discharge (pending progress): Home Health         ?? Other Comments/Recommendations/DME: bedside commode and rolling walker       Plan/Intent for next treatment: Physical therapy for bed mobility, transfers, gait training, strength/ROM exercises, modalities for pain, and patient education.             Continue to follow patient twice daily for  duration of hospital stay to address goals per initial evaluation.  PT Patient Time In/Time Out  Time In: 1030  Time Out: [address], [place]

## 2011-03-26 NOTE — Progress Notes (Signed)
Assessment complete.  Gauze and tegaderm dressing to right hip dry and intact. No distress present. No needs voiced. Call light in reach.

## 2011-03-26 NOTE — Progress Notes (Signed)
Orthopedic Progress Note    March 26, 2011  Admit Date: 03/24/2011  Admit Diagnosis: OA  OA    Post Op day: 2 Days Post-Op    Subjective:     Brian Buckley     No complaints   He would like to go home today after joint camp     Objective:     Vital Signs:    Temp (24hrs), Avg:98.9 ??F (37.2 ??C), Min:98.2 ??F (36.8 ??C), Max:100.1 ??F (37.8 ??C)      LAB:    @ABNORMALFLAGLEGEND @  Lab Results   Component Value Date/Time    INR 0.9 02/22/2011 12:35 PM    INR 1.0 08/24/2010 12:11 PM     Lab Results   Component Value Date/Time    HGB 11.7 03/26/2011  4:48 AM    HGB 12.2 03/25/2011  4:44 AM       Physical Exam:  No neurovascular changes noted     Dressing:  Clean and dry   Wound: no obvious problems noted     Plan:     Continue PT/OT/Rehab  SS for disposition    Okay to discharge if the patient is stable and comfortable with rehab. plans        Signed By: Marcellina Millin, MD

## 2011-03-27 NOTE — Progress Notes (Addendum)
[place]     Pt. Last Name: [facility]       Pt. First Name: Brian [address]ley [address]   MR#: [id number] / Admit#: [id number]   [place], [place] [address]    DOB: 1946/10/08 / Age: 65  Attn.: Jacquelin Hawking  Location: [facility] - [address]        Case Management - Progress Note  Initial Open Date: 03/25/2011   Case Manager: Elby Showers, BSW    Initial Open Date: 03/25/2011  Social Worker: Elby Showers BSW    Expected Date of Discharge: 03/27/2011  Transferred From: Home  ECF Bed Held Until:   Bed Held By:     Power of Attorney:   POA/Guardian/Conservator Capacity:    Primary Caregiver: wife  Living Arrangements:     Source of Income:   Payee: medicare  Psychosocial History:   Cultural/Religious/Language Issues:   Education Level:   ADLS/Current Living Arrangements Issues: revision of his rtha married    supportive  has dme    [facility] Providers: [facility]    Will patient perform self care at discharge? Y    Anticipated Discharge Disposition Goal: Home with Home Health Care    Assessment/Plan:   03/27/2011 12:25PPt d/c home with home health arranged through [facility],   alerted on call with home care and forwarded d/c information in support of   post hospital planning.  Forwarded all d/c information to [facility].  Roseanne Reno, [place]    03/25/2011 08:54A  Sw met with patient this am to review dc plan.  Pt admitted   for a revision of his right hip.  Pt pleasant, up beat.  He tells sw he knows   what is expected from him concerning therapy.  He has support from his wife   and owns all needed dme.  He is requesting [facility] at dc.  Sw   placed referral.  Anticipate dc Sunday if medically stable.  Elby Showers, BSW         Resources at Discharge:   Home Health Referral        Service Providers at Discharge:  [facility]

## 2011-03-30 LAB — CULTURE, ANAEROBIC: Culture result:: NO GROWTH

## 2011-04-13 NOTE — Discharge Summary (Signed)
.    [facility]  Total Joint Discharge Summary      Patient ID:  Brian Buckley  [id number]  65 y.o.  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 If allergic to Ancef or due to other indications, patient was given Vancomycin      Hospital Medications given:   No current facility-administered medications for this encounter.     Current Outpatient Prescriptions   Medication Sig   ??? aspirin delayed-release 81 mg tablet Take 1 Tab by mouth two (2) times a day.   ??? oxyCODONE (ROXICODONE) 10 mg Tab tablet Take 1 Tab by mouth every four (4) hours as needed.   ??? mupirocin calcium (BACTROBAN NASAL) 2 % nasal ointment by Both Nostrils route two (2) times a day. Pt did 5 day regimen of bactroban at home    ??? celecoxib (CELEBREX) 200 mg capsule Take  by mouth daily.     ??? losartan-hydrochlorothiazide (HYZAAR) 100-25 mg per tablet Take 1 Tab by mouth daily (with lunch).   ??? metoprolol-XL (TOPROL-XL) 100 mg XL tablet Take 100 mg by mouth daily (after lunch). Patient instructed to take morning of surgery per anesthesia guidelines               Additional DVT Prophylaxis:  TED Hose,Plexi-Pulse    Postoperative transfusions: none  Post Op complications: none    Hemoglobin at discharge:   Lab Results   Component Value Date/Time    HGB 11.7 03/26/2011  4:48 AM       Wound appears to be healing without any evidence of infection.   Physical Therapy started on the day of surgery and progressed.   PT/OT:          Assistive Device: Walker (comment)                Discharged to: [facility] with [facility]    Discharge instructions:   -Rx pain medication given  - Anticoagulate with: Ecotrin 325mg  PO BID x 5 weeks unless patient is on Coumadin, plavix, or similar medication   -Resume pre hospital diet             -Resume home medications per medical continuation form     -Ambulate with walker, appropriate total joint protocol  -Follow up in office as scheduled       Signed:  Ines Bloomer, PA  04/13/2011  8:26 AM

## 2011-07-22 LAB — BASIC METABOLIC PANEL
CO2: 28
Calcium: 8.8
Chloride: 102
Creatinine, Ser: 1.15
Glucose, Bld: 118 — ABNORMAL HIGH
Potassium: 3.8
Sodium: 137

## 2011-07-22 LAB — URINALYSIS, ROUTINE W REFLEX MICROSCOPIC
Bilirubin Urine: NEGATIVE
Glucose, UA: NEGATIVE
Hgb urine dipstick: NEGATIVE
Ketones, ur: NEGATIVE
Nitrite: NEGATIVE
Protein, ur: NEGATIVE
Specific Gravity, Urine: 1.02
Urobilinogen, UA: 0.2
pH: 6.5

## 2011-07-22 LAB — CBC
HCT: 29.1 — ABNORMAL LOW
HCT: 30.4 — ABNORMAL LOW
HCT: 35.6 — ABNORMAL LOW
HCT: 44
Hemoglobin: 10.2 — ABNORMAL LOW
Hemoglobin: 10.5 — ABNORMAL LOW
Hemoglobin: 12.2 — ABNORMAL LOW
MCHC: 34.3
MCHC: 35
MCV: 88.4
MCV: 89
MCV: 89.8
MCV: 89.9
Platelets: 178
Platelets: 241
Platelets: 252
RBC: 3.29 — ABNORMAL LOW
RBC: 3.38 — ABNORMAL LOW
RBC: 3.96 — ABNORMAL LOW
RBC: 4.95
RDW: 13.2
RDW: 13.3
WBC: 10.1
WBC: 10.5
WBC: 17.1 — ABNORMAL HIGH

## 2011-07-22 LAB — BASIC METABOLIC PANEL WITH GFR
BUN: 16
CO2: 31
Calcium: 8 — ABNORMAL LOW
Chloride: 102
Creatinine, Ser: 1.18
GFR calc non Af Amer: >60
Glucose, Bld: 150 — ABNORMAL HIGH
Potassium: 4.8
Sodium: 137

## 2011-07-22 LAB — PROTIME-INR
INR: 0.9
INR: 1.2
Prothrombin Time: 12.5
Prothrombin Time: 14.5
Prothrombin Time: 15.5 — ABNORMAL HIGH
Prothrombin Time: 15.9 — ABNORMAL HIGH

## 2011-07-22 LAB — TYPE AND SCREEN
ABO/RH(D): A NEG
Antibody Screen: NEGATIVE

## 2011-07-22 LAB — DIFFERENTIAL
Basophils Absolute: 0
Basophils Relative: 0
Eosinophils Absolute: 0.1
Eosinophils Relative: 2
Lymphocytes Relative: 21
Lymphs Abs: 1.9
Monocytes Absolute: 0.5
Monocytes Relative: 5
Neutro Abs: 6.6
Neutrophils Relative %: 72

## 2011-07-22 LAB — ABO/RH: ABO/RH(D): A NEG

## 2011-12-14 DIAGNOSIS — R609 Edema, unspecified: Secondary | ICD-10-CM | POA: Diagnosis not present

## 2011-12-14 DIAGNOSIS — J069 Acute upper respiratory infection, unspecified: Secondary | ICD-10-CM | POA: Diagnosis not present

## 2011-12-14 DIAGNOSIS — I10 Essential (primary) hypertension: Secondary | ICD-10-CM | POA: Diagnosis not present

## 2012-03-05 DIAGNOSIS — Z09 Encounter for follow-up examination after completed treatment for conditions other than malignant neoplasm: Secondary | ICD-10-CM | POA: Diagnosis not present

## 2012-03-05 DIAGNOSIS — Z96649 Presence of unspecified artificial hip joint: Secondary | ICD-10-CM | POA: Diagnosis not present

## 2012-04-17 DIAGNOSIS — R209 Unspecified disturbances of skin sensation: Secondary | ICD-10-CM | POA: Diagnosis not present

## 2012-04-17 DIAGNOSIS — Z79899 Other long term (current) drug therapy: Secondary | ICD-10-CM | POA: Diagnosis not present

## 2012-04-17 DIAGNOSIS — I10 Essential (primary) hypertension: Secondary | ICD-10-CM | POA: Diagnosis not present

## 2012-04-17 DIAGNOSIS — R509 Fever, unspecified: Secondary | ICD-10-CM | POA: Diagnosis not present

## 2012-04-17 DIAGNOSIS — R259 Unspecified abnormal involuntary movements: Secondary | ICD-10-CM | POA: Diagnosis not present

## 2012-04-17 DIAGNOSIS — R6889 Other general symptoms and signs: Secondary | ICD-10-CM | POA: Diagnosis not present

## 2012-04-17 DIAGNOSIS — R110 Nausea: Secondary | ICD-10-CM | POA: Diagnosis not present

## 2012-04-17 DIAGNOSIS — Z7982 Long term (current) use of aspirin: Secondary | ICD-10-CM | POA: Diagnosis not present

## 2012-04-17 DIAGNOSIS — M129 Arthropathy, unspecified: Secondary | ICD-10-CM | POA: Diagnosis not present

## 2012-04-17 DIAGNOSIS — G4730 Sleep apnea, unspecified: Secondary | ICD-10-CM | POA: Diagnosis not present

## 2012-04-17 LAB — CBC WITH AUTOMATED DIFF
ABS. BASOPHILS: 0 10*3/uL (ref 0.0–0.2)
ABS. EOSINOPHILS: 0 10*3/uL (ref 0.0–0.8)
ABS. IMM. GRANS.: 0.1 10*3/uL (ref 0.0–0.5)
ABS. LYMPHOCYTES: 0.7 10*3/uL (ref 0.5–4.6)
ABS. MONOCYTES: 0.6 10*3/uL (ref 0.1–1.3)
ABS. NEUTROPHILS: 11.5 10*3/uL — ABNORMAL HIGH (ref 1.7–8.2)
BASOPHILS: 0 % (ref 0.0–2.0)
EOSINOPHILS: 0 % — ABNORMAL LOW (ref 0.5–7.8)
HCT: 43.8 % (ref 41.1–50.3)
HGB: 15.7 g/dL (ref 13.2–17.1)
IMMATURE GRANULOCYTES: 0.4 % (ref 0.0–5.0)
LYMPHOCYTES: 5 % — ABNORMAL LOW (ref 13–44)
MCH: 30.5 PG (ref 26.1–32.9)
MCHC: 35.8 g/dL — ABNORMAL HIGH (ref 31.4–35.0)
MCV: 85 FL (ref 79.6–97.8)
MONOCYTES: 5 % (ref 4.0–12.0)
MPV: 10.2 FL — ABNORMAL LOW (ref 10.8–14.1)
NEUTROPHILS: 90 % — ABNORMAL HIGH (ref 43–78)
PLATELET: 158 10*3/uL (ref 150–450)
RBC: 5.15 M/uL (ref 4.23–5.67)
RDW: 12.5 % (ref 11.9–14.6)
WBC: 12.9 10*3/uL — ABNORMAL HIGH (ref 4.3–11.1)

## 2012-04-17 LAB — METABOLIC PANEL, BASIC
Anion gap: 11 mmol/L (ref 7–16)
BUN: 17 MG/DL (ref 8–23)
CO2: 28 MMOL/L (ref 21–32)
Calcium: 8.5 MG/DL (ref 8.3–10.4)
Chloride: 102 MMOL/L (ref 98–107)
Creatinine: 1.11 MG/DL (ref 0.8–1.5)
GFR est AA: >60 mL/min/{1.73_m2} (ref >60)
GFR est non-AA: >60 mL/min/{1.73_m2} (ref >60)
Glucose: 91 MG/DL (ref 65–100)
Potassium: 3.7 MMOL/L (ref 3.5–5.1)
Sodium: 141 MMOL/L (ref 136–145)

## 2012-04-17 LAB — POC TROPONIN: Troponin-I (POC): 0 ng/ml (ref 0.0–0.08)

## 2012-04-17 MED ORDER — SODIUM CHLORIDE 0.9% BOLUS IV
0.9 % | Freq: Once | INTRAVENOUS | Status: AC
Start: 2012-04-17 — End: 2012-04-17
  Administered 2012-04-17: 23:00:00 via INTRAVENOUS

## 2012-04-17 MED ORDER — ACETAMINOPHEN 500 MG TAB
500 mg | ORAL | Status: AC
Start: 2012-04-17 — End: 2012-04-17
  Administered 2012-04-17: 22:00:00 via ORAL

## 2012-04-17 MED ORDER — IBUPROFEN 800 MG TAB
800 mg | ORAL | Status: AC
Start: 2012-04-17 — End: 2012-04-17
  Administered 2012-04-17: 23:00:00 via ORAL

## 2012-04-17 MED FILL — IBUPROFEN 800 MG TAB: 800 mg | ORAL | Qty: 1

## 2012-04-17 MED FILL — MAPAP EXTRA STRENGTH 500 MG TABLET: 500 mg | ORAL | Qty: 2

## 2012-04-17 NOTE — ED Notes (Signed)
PT did not have any luck obtaining a urine specimen at this time.

## 2012-04-17 NOTE — ED Notes (Signed)
I have reviewed discharge instructions with the patient.  The patient verbalized understanding.

## 2012-04-17 NOTE — ED Notes (Signed)
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  He started with left hand cold and red.  He felt twitching in his hand arms and belly.  Currently  He is having left hand numbness and pain.

## 2012-04-17 NOTE — ED Notes (Signed)
Pt has IVF's infusing and is awaiting to collect a urine specimen.

## 2012-04-17 NOTE — ED Notes (Signed)
Pt is up ambulating to the bathroom to collect a urine specimen.

## 2012-04-17 NOTE — ED Notes (Signed)
IVF's completed.  Pt ambulating to the bathroom to attempt to get a urine specimen

## 2012-04-17 NOTE — ED Notes (Signed)
Report to Amanda Fisher, RN

## 2012-04-17 NOTE — ED Provider Notes (Signed)
HPI Comments: Patient developed rigors while at work and spiked a fever of 102. He felt generalized body pain and weakness and noted that both his hands changed color but are now baCK TO BASELINE.    Patient is a 66 y.o. male presenting with numbness and hand pain. The history is provided by the patient and the spouse.   Numbness  This is a new problem. The current episode started 3 to 5 hours ago. There was no focality noted. Pertinent negatives include no focal weakness, no slurred speech, no memory loss, no movement disorder, no mental status change and patient does not experience disorientation. There has been a fever of 101 - 101.9 F. Associated symptoms include nausea. Pertinent negatives include no shortness of breath, no chest pain, no vomiting, no bowel incontinence and no bladder incontinence.   Hand Pain   Associated symptoms include numbness.        Past Medical History   Diagnosis Date   ??? Hypertension    ??? Arthritis    ??? Nausea & vomiting    ??? Unspecified sleep apnea      does not use CPAP        Past Surgical History   Procedure Date   ??? Hx other surgical 1965     pilonial cyst   ??? Pr total hip arthroplasty 1995, 2009     right, left   ??? Hx orthopaedic      right big toe fusion   ??? Pr total hip arthroplasty 11/11     revision, left         Family History   Problem Relation Age of Onset   ??? Asthma Mother    ??? Cancer Father    ??? Malignant Hyperthermia Neg Hx    ??? Pseudocholinesterase Deficiency Neg Hx    ??? Delayed Awakening Neg Hx    ??? Post-op Nausea/Vomiting Neg Hx    ??? Emergence Delirium Neg Hx    ??? Post-op Cognitive Dysfunction Neg Hx    ??? Other Neg Hx         History     Social History   ??? Marital Status: MARRIED     Spouse Name: N/A     Number of Children: N/A   ??? Years of Education: N/A     Occupational History   ??? Not on file.     Social History Main Topics   ??? Smoking status: Never Smoker    ??? Smokeless tobacco: Not on file   ??? Alcohol Use: No   ??? Drug Use: No   ??? Sexually Active:      Other  Topics Concern   ??? Not on file     Social History Narrative   ??? No narrative on file                  ALLERGIES: Review of patient's allergies indicates no known allergies.      Review of Systems   Constitutional: Positive for fever, chills and fatigue.   Respiratory: Negative for shortness of breath.    Cardiovascular: Negative for chest pain.   Gastrointestinal: Positive for nausea. Negative for vomiting and bowel incontinence.   Genitourinary: Negative for bladder incontinence.   Neurological: Positive for numbness. Negative for focal weakness.   Psychiatric/Behavioral: Negative for memory loss.   All other systems reviewed and are negative.        Filed Vitals:    04/17/12 1755 04/17/12 1830 04/17/12 1953 04/17/12 2148  BP: 129/71  106/63 93/56   Pulse: 86  77 63   Temp: 102.6 ??F (39.2 ??C) 100.2 ??F (37.9 ??C) 99.4 ??F (37.4 ??C)    Resp: 20  16 16    Height:       Weight:       SpO2: 97%               Physical Exam   Constitutional: He is oriented to person, place, and time. He appears well-developed and well-nourished.   HENT:   Head: Normocephalic.   Eyes: Pupils are equal, round, and reactive to light.   Neck: Normal range of motion.   Cardiovascular: Normal rate, regular rhythm, normal heart sounds and intact distal pulses.    Pulmonary/Chest: Effort normal and breath sounds normal.   Abdominal: Soft. Bowel sounds are normal.   Musculoskeletal: Normal range of motion.   Neurological: He is alert and oriented to person, place, and time.   Skin: Skin is warm and dry.        MDM     Amount and/or Complexity of Data Reviewed:   Clinical lab tests:  Ordered and reviewed  Tests in the radiology section of CPT??:  Ordered and reviewed  Tests in the medicine section of the CPT??:  Ordered and reviewed  Risk of Significant Complications, Morbidity, and/or Mortality:   Presenting problems:  High  Diagnostic procedures:  High  Management options:  High  Progress:   Patient progress:  Improved      Procedures

## 2012-04-18 LAB — EKG, 12 LEAD, INITIAL
Atrial Rate: 86 {beats}/min
Calculated P Axis: 69 degrees
Calculated R Axis: 25 degrees
Calculated T Axis: 52 degrees
Diagnosis: NORMAL
P-R Interval: 160 ms
Q-T Interval: 364 ms
QRS Duration: 84 ms
QTC Calculation (Bezet): 435 ms
Ventricular Rate: 86 {beats}/min

## 2012-04-19 DIAGNOSIS — R252 Cramp and spasm: Secondary | ICD-10-CM | POA: Diagnosis not present

## 2012-04-19 DIAGNOSIS — M79609 Pain in unspecified limb: Secondary | ICD-10-CM | POA: Diagnosis not present

## 2012-04-19 DIAGNOSIS — R609 Edema, unspecified: Secondary | ICD-10-CM | POA: Diagnosis not present

## 2012-04-19 DIAGNOSIS — R21 Rash and other nonspecific skin eruption: Secondary | ICD-10-CM | POA: Diagnosis not present

## 2012-08-22 DIAGNOSIS — Z79899 Other long term (current) drug therapy: Secondary | ICD-10-CM | POA: Diagnosis not present

## 2012-08-22 DIAGNOSIS — I10 Essential (primary) hypertension: Secondary | ICD-10-CM | POA: Diagnosis not present

## 2012-08-22 DIAGNOSIS — Z1331 Encounter for screening for depression: Secondary | ICD-10-CM | POA: Diagnosis not present

## 2012-08-22 DIAGNOSIS — Z1211 Encounter for screening for malignant neoplasm of colon: Secondary | ICD-10-CM | POA: Diagnosis not present

## 2012-08-22 DIAGNOSIS — I2510 Atherosclerotic heart disease of native coronary artery without angina pectoris: Secondary | ICD-10-CM | POA: Diagnosis not present

## 2012-08-22 DIAGNOSIS — G4730 Sleep apnea, unspecified: Secondary | ICD-10-CM | POA: Diagnosis not present

## 2012-08-22 DIAGNOSIS — Z125 Encounter for screening for malignant neoplasm of prostate: Secondary | ICD-10-CM | POA: Diagnosis not present

## 2012-08-22 DIAGNOSIS — Z23 Encounter for immunization: Secondary | ICD-10-CM | POA: Diagnosis not present

## 2013-03-21 DIAGNOSIS — M129 Arthropathy, unspecified: Secondary | ICD-10-CM | POA: Diagnosis not present

## 2013-03-21 DIAGNOSIS — I2510 Atherosclerotic heart disease of native coronary artery without angina pectoris: Secondary | ICD-10-CM | POA: Diagnosis not present

## 2013-03-21 DIAGNOSIS — I10 Essential (primary) hypertension: Secondary | ICD-10-CM | POA: Diagnosis not present

## 2013-03-21 DIAGNOSIS — R079 Chest pain, unspecified: Secondary | ICD-10-CM | POA: Diagnosis not present

## 2013-07-22 ENCOUNTER — Other Ambulatory Visit (HOSPITAL_COMMUNITY): Payer: Self-pay | Admitting: *Deleted

## 2013-07-22 DIAGNOSIS — Z96649 Presence of unspecified artificial hip joint: Secondary | ICD-10-CM | POA: Diagnosis not present

## 2013-07-22 DIAGNOSIS — T84099A Other mechanical complication of unspecified internal joint prosthesis, initial encounter: Secondary | ICD-10-CM | POA: Diagnosis not present

## 2013-07-22 DIAGNOSIS — M25552 Pain in left hip: Secondary | ICD-10-CM

## 2013-07-26 ENCOUNTER — Other Ambulatory Visit (HOSPITAL_COMMUNITY): Payer: Self-pay | Admitting: *Deleted

## 2013-07-26 ENCOUNTER — Encounter (HOSPITAL_COMMUNITY): Payer: Medicare Other

## 2013-07-26 ENCOUNTER — Encounter (HOSPITAL_COMMUNITY)
Admission: RE | Admit: 2013-07-26 | Discharge: 2013-07-26 | Disposition: A | Discharge status: Home / Self Care | Payer: Medicare Other | Source: Ambulatory Visit | Attending: Orthopedic Surgery | Admitting: Orthopedic Surgery

## 2013-07-26 DIAGNOSIS — M25552 Pain in left hip: Secondary | ICD-10-CM

## 2013-07-26 DIAGNOSIS — M25559 Pain in unspecified hip: Secondary | ICD-10-CM | POA: Insufficient documentation

## 2013-07-26 DIAGNOSIS — Z96649 Presence of unspecified artificial hip joint: Secondary | ICD-10-CM | POA: Diagnosis not present

## 2013-07-26 MED ORDER — TECHNETIUM TC 99M MEDRONATE IV KIT
25.0000 | PACK | Freq: Once | INTRAVENOUS | Status: AC | PRN
  Administered 2013-07-26: 25 via INTRAVENOUS

## 2013-08-05 DIAGNOSIS — M5137 Other intervertebral disc degeneration, lumbosacral region: Secondary | ICD-10-CM | POA: Diagnosis not present

## 2013-08-05 DIAGNOSIS — Z96649 Presence of unspecified artificial hip joint: Secondary | ICD-10-CM | POA: Diagnosis not present

## 2013-08-22 ENCOUNTER — Other Ambulatory Visit: Payer: Self-pay | Admitting: Orthopedic Surgery

## 2013-08-22 DIAGNOSIS — M545 Low back pain: Secondary | ICD-10-CM

## 2013-08-31 ENCOUNTER — Other Ambulatory Visit: Payer: Medicare Other

## 2013-09-23 DIAGNOSIS — Z23 Encounter for immunization: Secondary | ICD-10-CM | POA: Diagnosis not present

## 2013-09-23 DIAGNOSIS — J40 Bronchitis, not specified as acute or chronic: Secondary | ICD-10-CM | POA: Diagnosis not present

## 2013-09-27 DIAGNOSIS — M76899 Other specified enthesopathies of unspecified lower limb, excluding foot: Secondary | ICD-10-CM | POA: Diagnosis not present

## 2013-10-28 DIAGNOSIS — I2510 Atherosclerotic heart disease of native coronary artery without angina pectoris: Secondary | ICD-10-CM | POA: Diagnosis not present

## 2013-10-28 DIAGNOSIS — I10 Essential (primary) hypertension: Secondary | ICD-10-CM | POA: Diagnosis not present

## 2013-10-28 DIAGNOSIS — Z1211 Encounter for screening for malignant neoplasm of colon: Secondary | ICD-10-CM | POA: Diagnosis not present

## 2013-10-28 DIAGNOSIS — N529 Male erectile dysfunction, unspecified: Secondary | ICD-10-CM | POA: Diagnosis not present

## 2014-02-03 DIAGNOSIS — Z1211 Encounter for screening for malignant neoplasm of colon: Secondary | ICD-10-CM | POA: Diagnosis not present

## 2014-05-09 DIAGNOSIS — B356 Tinea cruris: Secondary | ICD-10-CM | POA: Diagnosis not present

## 2014-05-09 DIAGNOSIS — E782 Mixed hyperlipidemia: Secondary | ICD-10-CM | POA: Diagnosis not present

## 2014-05-09 DIAGNOSIS — I2510 Atherosclerotic heart disease of native coronary artery without angina pectoris: Secondary | ICD-10-CM | POA: Diagnosis not present

## 2014-05-09 DIAGNOSIS — Z125 Encounter for screening for malignant neoplasm of prostate: Secondary | ICD-10-CM | POA: Diagnosis not present

## 2014-05-09 DIAGNOSIS — I10 Essential (primary) hypertension: Secondary | ICD-10-CM | POA: Diagnosis not present

## 2014-08-28 NOTE — Telephone Encounter (Signed)
Please call and schedule an AWV exam.

## 2014-08-30 DIAGNOSIS — M10072 Idiopathic gout, left ankle and foot: Secondary | ICD-10-CM | POA: Diagnosis not present

## 2014-09-02 NOTE — Telephone Encounter (Signed)
Patient states he transfer out.

## 2014-09-29 DIAGNOSIS — Z23 Encounter for immunization: Secondary | ICD-10-CM | POA: Diagnosis not present

## 2015-01-19 DIAGNOSIS — R1030 Lower abdominal pain, unspecified: Secondary | ICD-10-CM | POA: Diagnosis not present

## 2015-05-04 DIAGNOSIS — S39011D Strain of muscle, fascia and tendon of abdomen, subsequent encounter: Secondary | ICD-10-CM | POA: Diagnosis not present

## 2015-05-19 ENCOUNTER — Ambulatory Visit: Payer: Medicare Other | Attending: Family Medicine | Admitting: Physical Therapy

## 2015-05-19 DIAGNOSIS — S76911S Strain of unspecified muscles, fascia and tendons at thigh level, right thigh, sequela: Secondary | ICD-10-CM | POA: Insufficient documentation

## 2015-05-19 DIAGNOSIS — X58XXXS Exposure to other specified factors, sequela: Secondary | ICD-10-CM | POA: Diagnosis not present

## 2015-05-19 DIAGNOSIS — R1030 Lower abdominal pain, unspecified: Secondary | ICD-10-CM | POA: Diagnosis not present

## 2015-05-19 DIAGNOSIS — R29898 Other symptoms and signs involving the musculoskeletal system: Secondary | ICD-10-CM | POA: Insufficient documentation

## 2015-05-19 DIAGNOSIS — R1031 Right lower quadrant pain: Secondary | ICD-10-CM

## 2015-05-19 DIAGNOSIS — S76011S Strain of muscle, fascia and tendon of right hip, sequela: Secondary | ICD-10-CM | POA: Diagnosis not present

## 2015-05-19 NOTE — Therapy (Addendum)
[facility] [address]  [place], [place], [address] Phone: [phone]   Fax:  [phone]  Physical Therapy Evaluation  Patient Details  Name: Joshua Pena MRN: [id number] Date of Birth: [birth year] Referring Provider:  Gaynelle Arabian, MD  Encounter Date: 05/19/2015      PT End of Session - 05/19/15 1451    Visit Number 1   Number of Visits 12   Date for PT Re-Evaluation 06/30/15   PT Start Time 1341   PT Stop Time 1435   PT Time Calculation (min) 54 min   Activity Tolerance Patient tolerated treatment well   Behavior During Therapy [facility] for tasks assessed/performed      No past medical history on file.  No past surgical history on file.  There were no vitals filed for this visit.  Visit Diagnosis:  Right groin pain  Strain of iliopsoas muscle, right, sequela  Strain of right hip adductor muscle, sequela  Weakness of right hip      Subjective Assessment - 05/19/15 1346    Subjective Patient reports slip while push-mowing ~4 months ago resulting in a pulled right groin muscle. No fall occured. Saw MD who determined negative for a hernia and identified groin/abdominal strain and prescribed rest and avoidance of painful movements. Patient states MD informed him it would be a slow recovery but patient feels like it is taking too long. Patient denies pain with mobility/transfers and gait with ability to perform daily activities and part-time work without pain. Pain most noticeable when driving when moving foot from gas to brake.   Pertinent History Bilateral THR   Limitations Sitting   How long can you sit comfortably? prefers reclined position   Diagnostic tests Hernia evaluation - negative; no x-rays   Patient Stated Goals To be able to drive without pain.   Currently in Pain? No/denies   Pain Score --  Least 0/10, Avg 4/10, Worst 7-8/10   Pain Location Groin   Pain Orientation Right   Pain  Radiating Towards n/a   Pain Onset More than a month ago  ~4 months   Pain Frequency Intermittent   Aggravating Factors  hip adduction and IR   Pain Relieving Factors Alieve, rest   Effect of Pain on Daily Activities Wife has to drive for him   Multiple Pain Sites No            [facility] PT Assessment - 05/19/15 1358    Assessment   Medical Diagnosis R groin pain/muscle strain   Onset Date/Surgical Date 01/17/15   Next MD Visit as needed   Prior Therapy rest, avoidance of painful movement   Balance Screen   Has the patient fallen in the past 6 months No   Has the patient had a decrease in activity level because of a fear of falling?  No   Is the patient reluctant to leave their home because of a fear of falling?  No   Home Environment   Living Environment Private residence   Living Arrangements Spouse/significant other   Type of [place] to enter   Entrance Stairs-Number of Steps 1   [place] Two level;Laundry or work area in basement   Alternate Therapist, sports of Steps 13   Prior Function   Level of Independence Independent   Vocation Retired   Leisure Reading, yard work   Cognition   Overall Cognitive Status Within [employer] [facility] for tasks  assessed   Observation/Other Assessments   Focus on Therapeutic Outcomes (FOTO)  70% (30% limited); predicted 72% (28% limited)   ROM / Strength   AROM / PROM / Strength AROM;Strength   AROM   Overall AROM Comments Bilateral ROM WFL   AROM Assessment Site Lumbar   Lumbar Flexion hands to just below (limitation d/t abdominal girth)   Lumbar Extension [facility]   Lumbar - Right Side [facility]   Lumbar - Left Side Bend WFL   Lumbar - Right Rotation WFL   Lumbar - Left Rotation [facility]   Strength   Strength Assessment Site Hip   Right/Left Hip Right;Left   Right Hip Flexion 4-/5   Right Hip Extension 4-/5   Right Hip External Rotation  --  4-/5   Right Hip Internal Rotation  --  3-/5   Right Hip ABduction  4-/5   Right Hip ADduction 2/5   Left Hip Flexion 4+/5   Left Hip Extension 4+/5   Left Hip External Rotation  --  4+/5   Left Hip Internal Rotation  --  4+/5   Left Hip ABduction 4+/5   Left Hip ADduction 4+/5   Flexibility   Soft Tissue Assessment /Muscle Length yes   Hamstrings bilateral tighness R>L   Quadriceps WFL   ITB WFL   Quadratus Lumborum WFL   Ambulation/Gait   Ambulation/Gait Assistance 7: Independent         Today's Treatment  HEP instruction: Frog hip adductor/groin stretch 3x20" Hamstring stretch with towel/strap 3x20" Hip adduction squeeze with pillow/small ball x20 Standing hip flexion x10 Standing hip abduction x10 Standing hip extension x10          PT Education - 05/19/15 1605    Education provided Yes   Education Details Initial HEP - Patient instructed to perform exercises within pain-free ROM   Person(s) Educated Patient   Methods Explanation;Demonstration;Handout   Comprehension Verbalized understanding;Returned demonstration;Need further instruction          PT Short Term Goals - 05/19/15 1738    PT SHORT TERM GOAL #1   Title Independent with HEP (06/09/15)   Time 3   Period Weeks   Status New           PT Long Term Goals - 05/19/15 1739    PT LONG TERM GOAL #1   Title Independent with advanced HEP (06/30/15)   Time 6   Period Weeks   Status New   PT LONG TERM GOAL #2   Title Patient will be able to transition foot from gas to brake while driving without pain (06/30/15)   Time 6   Period Weeks   Status New               Plan - 05/19/15 1721    Clinical Impression Statement Patient presents to OPPT with ~4 month h/o lower abdominal wall/groin/proximal thigh pain following a slip of right foot while push-mowing lawn. Pain at time of eval primarly isolated to anterior groin and only present with active/resisted hip adduction & IR. Mechanism of injury and presentation consistent with iliopsoas/hip adductor strain.  Pain limits patient's ability to drive car, specifically transitioning foot from gas to brake.   Pt will benefit from skilled therapeutic intervention in order to improve on the following deficits Pain;Impaired flexibility;Decreased strength;Decreased activity tolerance;Impaired perceived functional ability   Rehab Potential Good   PT Frequency 2x / week   PT Duration 6 weeks   PT Treatment/Interventions Therapeutic exercise;Manual techniques;Passive range  of motion;Therapeutic activities;Moist Heat;Cryotherapy;Functional mobility training;Patient/family education   PT Next Visit Plan Review of HEP, hip flexibility/strengthening, modalities PRN   Consulted and Agree with Plan of Care Patient          G-Codes - 2015/06/10 1754    Functional Assessment Tool Used FOTO = 70% (30% limitation)   Functional Limitation Changing and maintaining body position   Changing and Maintaining Body Position Current Status (A5790) At least 20 percent but less than 40 percent impaired, limited or restricted   Changing and Maintaining Body Position Goal Status (X8333) At least 20 percent but less than 40 percent impaired, limited or restricted  predicted FOTO = 72% (28% limitation)       Problem List There are no active problems to display for this patient.   Percival Spanish, PT, MPT 10-Jun-2015, 6:02 PM  [facility] [address]  [place], [place], [address] Phone: [phone]   Fax:  [phone]    PHYSICAL THERAPY DISCHARGE SUMMARY  Visits from Start of Care: 1 of 12  Current functional level related to goals / functional outcomes:  Unchanged from eval as patient cancelled all follow up appointments secondary to "grandson not doing well".   Remaining deficits:  Unchanged from above   Education / Equipment:  HEP  G-Code:  FOTO = 70% (30% limitation)  Functional Limitation - Changing and maintaining body position   Current/Goal/Discharge Status: CJ (At least 20% but < 40% impaired, limited or restricted) - Eval only  Plan: Patient agrees to discharge.  Patient goals were not met. Patient is being discharged due to not returning since the last visit.  ?????       Percival Spanish, PT, MPT 07/08/2015, 1:50 PM  [facility] [address]  [place], [place], [address] Phone: [phone]   Fax:  [phone]

## 2015-05-25 ENCOUNTER — Ambulatory Visit: Payer: Medicare Other | Admitting: Physical Therapy

## 2015-05-27 ENCOUNTER — Ambulatory Visit: Payer: Medicare Other | Admitting: Rehabilitation

## 2015-06-01 ENCOUNTER — Ambulatory Visit: Payer: Medicare Other | Admitting: Physical Therapy

## 2015-06-03 ENCOUNTER — Ambulatory Visit: Payer: Medicare Other | Admitting: Physical Therapy

## 2015-06-08 ENCOUNTER — Ambulatory Visit: Payer: Medicare Other | Admitting: Physical Therapy

## 2015-06-10 ENCOUNTER — Ambulatory Visit: Payer: Medicare Other | Admitting: Physical Therapy

## 2015-06-15 ENCOUNTER — Ambulatory Visit: Payer: Medicare Other | Admitting: Rehabilitation

## 2015-06-17 ENCOUNTER — Ambulatory Visit: Payer: Medicare Other | Admitting: Physical Therapy

## 2015-07-09 DIAGNOSIS — R0609 Other forms of dyspnea: Secondary | ICD-10-CM | POA: Diagnosis not present

## 2015-07-09 DIAGNOSIS — I2510 Atherosclerotic heart disease of native coronary artery without angina pectoris: Secondary | ICD-10-CM | POA: Diagnosis not present

## 2015-07-09 DIAGNOSIS — I200 Unstable angina: Secondary | ICD-10-CM | POA: Diagnosis not present

## 2015-07-30 DIAGNOSIS — I10 Essential (primary) hypertension: Secondary | ICD-10-CM | POA: Diagnosis not present

## 2015-07-30 DIAGNOSIS — E782 Mixed hyperlipidemia: Secondary | ICD-10-CM | POA: Diagnosis not present

## 2015-07-30 DIAGNOSIS — R0609 Other forms of dyspnea: Secondary | ICD-10-CM | POA: Diagnosis not present

## 2015-07-30 DIAGNOSIS — I209 Angina pectoris, unspecified: Secondary | ICD-10-CM | POA: Diagnosis not present

## 2015-08-04 DIAGNOSIS — Z4682 Encounter for fitting and adjustment of non-vascular catheter: Secondary | ICD-10-CM | POA: Diagnosis not present

## 2015-08-04 DIAGNOSIS — I209 Angina pectoris, unspecified: Secondary | ICD-10-CM | POA: Diagnosis not present

## 2015-08-04 DIAGNOSIS — Z01818 Encounter for other preprocedural examination: Secondary | ICD-10-CM | POA: Diagnosis not present

## 2015-08-05 DIAGNOSIS — I25119 Atherosclerotic heart disease of native coronary artery with unspecified angina pectoris: Secondary | ICD-10-CM | POA: Diagnosis not present

## 2015-08-05 DIAGNOSIS — M1990 Unspecified osteoarthritis, unspecified site: Secondary | ICD-10-CM | POA: Diagnosis not present

## 2015-08-05 DIAGNOSIS — Z96643 Presence of artificial hip joint, bilateral: Secondary | ICD-10-CM | POA: Diagnosis not present

## 2015-08-05 DIAGNOSIS — I25111 Atherosclerotic heart disease of native coronary artery with angina pectoris with documented spasm: Secondary | ICD-10-CM | POA: Diagnosis not present

## 2015-08-05 DIAGNOSIS — I2582 Chronic total occlusion of coronary artery: Secondary | ICD-10-CM | POA: Diagnosis not present

## 2015-08-05 DIAGNOSIS — R0602 Shortness of breath: Secondary | ICD-10-CM | POA: Diagnosis not present

## 2015-08-05 DIAGNOSIS — E782 Mixed hyperlipidemia: Secondary | ICD-10-CM | POA: Diagnosis not present

## 2015-08-05 DIAGNOSIS — I10 Essential (primary) hypertension: Secondary | ICD-10-CM | POA: Diagnosis not present

## 2015-08-05 DIAGNOSIS — R079 Chest pain, unspecified: Secondary | ICD-10-CM | POA: Diagnosis not present

## 2015-08-05 DIAGNOSIS — Z9049 Acquired absence of other specified parts of digestive tract: Secondary | ICD-10-CM | POA: Diagnosis not present

## 2015-08-05 DIAGNOSIS — Z79899 Other long term (current) drug therapy: Secondary | ICD-10-CM | POA: Diagnosis not present

## 2015-08-05 DIAGNOSIS — G4730 Sleep apnea, unspecified: Secondary | ICD-10-CM | POA: Diagnosis not present

## 2015-08-05 DIAGNOSIS — Z7982 Long term (current) use of aspirin: Secondary | ICD-10-CM | POA: Diagnosis not present

## 2015-08-07 DIAGNOSIS — I2510 Atherosclerotic heart disease of native coronary artery without angina pectoris: Secondary | ICD-10-CM | POA: Diagnosis not present

## 2015-08-07 DIAGNOSIS — I081 Rheumatic disorders of both mitral and tricuspid valves: Secondary | ICD-10-CM | POA: Diagnosis not present

## 2015-08-11 DIAGNOSIS — Z9889 Other specified postprocedural states: Secondary | ICD-10-CM | POA: Diagnosis not present

## 2015-08-11 DIAGNOSIS — I499 Cardiac arrhythmia, unspecified: Secondary | ICD-10-CM | POA: Diagnosis not present

## 2015-08-11 DIAGNOSIS — R0602 Shortness of breath: Secondary | ICD-10-CM | POA: Diagnosis not present

## 2015-08-11 DIAGNOSIS — E782 Mixed hyperlipidemia: Secondary | ICD-10-CM | POA: Diagnosis present

## 2015-08-11 DIAGNOSIS — I2510 Atherosclerotic heart disease of native coronary artery without angina pectoris: Secondary | ICD-10-CM | POA: Diagnosis not present

## 2015-08-11 DIAGNOSIS — I25110 Atherosclerotic heart disease of native coronary artery with unstable angina pectoris: Secondary | ICD-10-CM | POA: Diagnosis not present

## 2015-08-11 DIAGNOSIS — I25119 Atherosclerotic heart disease of native coronary artery with unspecified angina pectoris: Secondary | ICD-10-CM | POA: Diagnosis present

## 2015-08-11 DIAGNOSIS — E876 Hypokalemia: Secondary | ICD-10-CM | POA: Diagnosis present

## 2015-08-11 DIAGNOSIS — Z951 Presence of aortocoronary bypass graft: Secondary | ICD-10-CM | POA: Diagnosis not present

## 2015-08-11 DIAGNOSIS — I4901 Ventricular fibrillation: Secondary | ICD-10-CM | POA: Diagnosis not present

## 2015-08-11 DIAGNOSIS — I2582 Chronic total occlusion of coronary artery: Secondary | ICD-10-CM | POA: Diagnosis present

## 2015-08-11 DIAGNOSIS — I25118 Atherosclerotic heart disease of native coronary artery with other forms of angina pectoris: Secondary | ICD-10-CM | POA: Diagnosis not present

## 2015-08-11 DIAGNOSIS — R079 Chest pain, unspecified: Secondary | ICD-10-CM | POA: Diagnosis not present

## 2015-08-11 DIAGNOSIS — I4581 Long QT syndrome: Secondary | ICD-10-CM | POA: Diagnosis not present

## 2015-08-11 DIAGNOSIS — M1990 Unspecified osteoarthritis, unspecified site: Secondary | ICD-10-CM | POA: Diagnosis present

## 2015-08-11 DIAGNOSIS — Z4682 Encounter for fitting and adjustment of non-vascular catheter: Secondary | ICD-10-CM | POA: Diagnosis not present

## 2015-08-11 DIAGNOSIS — Z23 Encounter for immunization: Secondary | ICD-10-CM | POA: Diagnosis not present

## 2015-08-11 DIAGNOSIS — G4730 Sleep apnea, unspecified: Secondary | ICD-10-CM | POA: Diagnosis present

## 2015-08-11 DIAGNOSIS — E785 Hyperlipidemia, unspecified: Secondary | ICD-10-CM | POA: Diagnosis not present

## 2015-08-11 DIAGNOSIS — R0989 Other specified symptoms and signs involving the circulatory and respiratory systems: Secondary | ICD-10-CM | POA: Diagnosis not present

## 2015-08-11 DIAGNOSIS — I10 Essential (primary) hypertension: Secondary | ICD-10-CM | POA: Diagnosis not present

## 2015-09-01 DIAGNOSIS — Z951 Presence of aortocoronary bypass graft: Secondary | ICD-10-CM | POA: Diagnosis not present

## 2015-09-01 DIAGNOSIS — I25119 Atherosclerotic heart disease of native coronary artery with unspecified angina pectoris: Secondary | ICD-10-CM | POA: Diagnosis not present

## 2015-09-01 DIAGNOSIS — I480 Paroxysmal atrial fibrillation: Secondary | ICD-10-CM | POA: Diagnosis not present

## 2015-09-01 DIAGNOSIS — I10 Essential (primary) hypertension: Secondary | ICD-10-CM | POA: Diagnosis not present

## 2015-09-01 DIAGNOSIS — E782 Mixed hyperlipidemia: Secondary | ICD-10-CM | POA: Diagnosis not present

## 2015-11-04 DIAGNOSIS — Z96643 Presence of artificial hip joint, bilateral: Secondary | ICD-10-CM | POA: Diagnosis not present

## 2015-11-04 DIAGNOSIS — I10 Essential (primary) hypertension: Secondary | ICD-10-CM | POA: Diagnosis not present

## 2015-11-04 DIAGNOSIS — E782 Mixed hyperlipidemia: Secondary | ICD-10-CM | POA: Diagnosis not present

## 2015-11-04 DIAGNOSIS — I2510 Atherosclerotic heart disease of native coronary artery without angina pectoris: Secondary | ICD-10-CM | POA: Diagnosis not present

## 2015-12-01 DIAGNOSIS — I10 Essential (primary) hypertension: Secondary | ICD-10-CM | POA: Diagnosis not present

## 2015-12-01 DIAGNOSIS — I480 Paroxysmal atrial fibrillation: Secondary | ICD-10-CM | POA: Diagnosis not present

## 2015-12-01 DIAGNOSIS — E782 Mixed hyperlipidemia: Secondary | ICD-10-CM | POA: Diagnosis not present

## 2015-12-01 DIAGNOSIS — Z951 Presence of aortocoronary bypass graft: Secondary | ICD-10-CM | POA: Diagnosis not present

## 2015-12-01 DIAGNOSIS — I25119 Atherosclerotic heart disease of native coronary artery with unspecified angina pectoris: Secondary | ICD-10-CM | POA: Diagnosis not present

## 2016-05-25 DIAGNOSIS — E782 Mixed hyperlipidemia: Secondary | ICD-10-CM | POA: Diagnosis not present

## 2016-05-25 DIAGNOSIS — I10 Essential (primary) hypertension: Secondary | ICD-10-CM | POA: Diagnosis not present

## 2016-05-25 DIAGNOSIS — Z96643 Presence of artificial hip joint, bilateral: Secondary | ICD-10-CM | POA: Diagnosis not present

## 2016-05-25 DIAGNOSIS — D485 Neoplasm of uncertain behavior of skin: Secondary | ICD-10-CM | POA: Diagnosis not present

## 2016-05-25 DIAGNOSIS — I2510 Atherosclerotic heart disease of native coronary artery without angina pectoris: Secondary | ICD-10-CM | POA: Diagnosis not present

## 2016-05-25 DIAGNOSIS — N529 Male erectile dysfunction, unspecified: Secondary | ICD-10-CM | POA: Diagnosis not present

## 2016-05-25 DIAGNOSIS — Z23 Encounter for immunization: Secondary | ICD-10-CM | POA: Diagnosis not present

## 2016-06-22 DIAGNOSIS — I10 Essential (primary) hypertension: Secondary | ICD-10-CM | POA: Diagnosis not present

## 2016-06-22 DIAGNOSIS — I25119 Atherosclerotic heart disease of native coronary artery with unspecified angina pectoris: Secondary | ICD-10-CM | POA: Diagnosis not present

## 2016-06-22 DIAGNOSIS — E782 Mixed hyperlipidemia: Secondary | ICD-10-CM | POA: Diagnosis not present

## 2016-06-22 DIAGNOSIS — Z951 Presence of aortocoronary bypass graft: Secondary | ICD-10-CM | POA: Diagnosis not present

## 2016-06-22 DIAGNOSIS — I480 Paroxysmal atrial fibrillation: Secondary | ICD-10-CM | POA: Diagnosis not present

## 2016-06-23 DIAGNOSIS — L308 Other specified dermatitis: Secondary | ICD-10-CM | POA: Diagnosis not present

## 2016-06-23 DIAGNOSIS — L570 Actinic keratosis: Secondary | ICD-10-CM | POA: Diagnosis not present

## 2016-12-08 DIAGNOSIS — I25810 Atherosclerosis of coronary artery bypass graft(s) without angina pectoris: Secondary | ICD-10-CM | POA: Diagnosis not present

## 2016-12-08 DIAGNOSIS — E785 Hyperlipidemia, unspecified: Secondary | ICD-10-CM | POA: Diagnosis not present

## 2016-12-08 DIAGNOSIS — I10 Essential (primary) hypertension: Secondary | ICD-10-CM | POA: Diagnosis not present

## 2016-12-08 DIAGNOSIS — R0602 Shortness of breath: Secondary | ICD-10-CM | POA: Diagnosis not present

## 2016-12-27 DIAGNOSIS — E785 Hyperlipidemia, unspecified: Secondary | ICD-10-CM | POA: Diagnosis not present

## 2016-12-27 DIAGNOSIS — R0602 Shortness of breath: Secondary | ICD-10-CM | POA: Diagnosis not present

## 2016-12-27 DIAGNOSIS — I25810 Atherosclerosis of coronary artery bypass graft(s) without angina pectoris: Secondary | ICD-10-CM | POA: Diagnosis not present

## 2016-12-27 DIAGNOSIS — I10 Essential (primary) hypertension: Secondary | ICD-10-CM | POA: Diagnosis not present

## 2017-01-05 DIAGNOSIS — I10 Essential (primary) hypertension: Secondary | ICD-10-CM | POA: Diagnosis not present

## 2017-01-05 DIAGNOSIS — I25810 Atherosclerosis of coronary artery bypass graft(s) without angina pectoris: Secondary | ICD-10-CM | POA: Diagnosis not present

## 2017-01-05 DIAGNOSIS — E785 Hyperlipidemia, unspecified: Secondary | ICD-10-CM | POA: Diagnosis not present

## 2017-06-19 DIAGNOSIS — M25511 Pain in right shoulder: Secondary | ICD-10-CM | POA: Diagnosis not present

## 2017-07-17 DIAGNOSIS — M25511 Pain in right shoulder: Secondary | ICD-10-CM | POA: Diagnosis not present

## 2017-08-15 DIAGNOSIS — I808 Phlebitis and thrombophlebitis of other sites: Secondary | ICD-10-CM | POA: Diagnosis not present

## 2017-08-16 DIAGNOSIS — I808 Phlebitis and thrombophlebitis of other sites: Secondary | ICD-10-CM | POA: Diagnosis not present

## 2017-09-21 DIAGNOSIS — E559 Vitamin D deficiency, unspecified: Secondary | ICD-10-CM | POA: Diagnosis not present

## 2017-09-21 DIAGNOSIS — I10 Essential (primary) hypertension: Secondary | ICD-10-CM | POA: Diagnosis not present

## 2017-09-21 DIAGNOSIS — E785 Hyperlipidemia, unspecified: Secondary | ICD-10-CM | POA: Diagnosis not present

## 2017-09-21 DIAGNOSIS — I2510 Atherosclerotic heart disease of native coronary artery without angina pectoris: Secondary | ICD-10-CM | POA: Diagnosis not present

## 2017-09-22 DIAGNOSIS — E785 Hyperlipidemia, unspecified: Secondary | ICD-10-CM | POA: Diagnosis not present

## 2017-09-22 DIAGNOSIS — E559 Vitamin D deficiency, unspecified: Secondary | ICD-10-CM | POA: Diagnosis not present

## 2017-09-22 DIAGNOSIS — I2510 Atherosclerotic heart disease of native coronary artery without angina pectoris: Secondary | ICD-10-CM | POA: Diagnosis not present

## 2017-09-22 DIAGNOSIS — I10 Essential (primary) hypertension: Secondary | ICD-10-CM | POA: Diagnosis not present

## 2017-09-25 DIAGNOSIS — M25511 Pain in right shoulder: Secondary | ICD-10-CM | POA: Diagnosis not present

## 2017-10-28 ENCOUNTER — Inpatient Hospital Stay: Primary: Student in an Organized Health Care Education/Training Program

## 2017-10-31 NOTE — Other (Signed)
Patient verified name&  DOB. Order to obtain consent  found in EHR &  matches case posting.    Type 1B surgery,  assessment complete.  Orders  received.    Labs per surgeon: none  Labs per anesthesia protocol: poc potassium signed and held DOS.  Echo 12/27/16 in care everywhere.    Patient answered medical/surgical history questions at their best of ability. All prior to admission medications documented in [facility].    Patient instructed to take the following medications the day of surgery according to anesthesia guidelines with a small sip of water: asa 81 mg, metoprolol .  Hold all vitamins 7 days prior to surgery and NSAIDS 5 days prior to surgery. Medications to be held none.    Patient instructed on the following:  Arrive at main Entrance, time of arrival to be called the day before by 1700.  NPO after midnight including gum, mints, and ice chips.  Responsible adult must drive patient to the hospital, stay during surgery, and patient requires supervision 24 hours after anesthesia  Use non-moisturizing soap in shower the night before surgery and on the morning of surgery.  Leave all valuables (money and jewelry) at home but bring insurance card and ID on       DOS.  Do not wear make-up, nail polish, lotions, cologne, perfumes, powders, or oil on skin.    Patient teach back successful and patient demonstrates knowledge of instruction.

## 2017-11-03 ENCOUNTER — Inpatient Hospital Stay: Payer: MEDICARE

## 2017-11-03 DIAGNOSIS — Z951 Presence of aortocoronary bypass graft: Secondary | ICD-10-CM | POA: Diagnosis not present

## 2017-11-03 DIAGNOSIS — M1990 Unspecified osteoarthritis, unspecified site: Secondary | ICD-10-CM | POA: Diagnosis not present

## 2017-11-03 DIAGNOSIS — M24111 Other articular cartilage disorders, right shoulder: Secondary | ICD-10-CM | POA: Diagnosis not present

## 2017-11-03 DIAGNOSIS — M75111 Incomplete rotator cuff tear or rupture of right shoulder, not specified as traumatic: Secondary | ICD-10-CM | POA: Diagnosis not present

## 2017-11-03 DIAGNOSIS — Z79899 Other long term (current) drug therapy: Secondary | ICD-10-CM | POA: Diagnosis not present

## 2017-11-03 DIAGNOSIS — M7541 Impingement syndrome of right shoulder: Secondary | ICD-10-CM | POA: Diagnosis not present

## 2017-11-03 DIAGNOSIS — E785 Hyperlipidemia, unspecified: Secondary | ICD-10-CM | POA: Diagnosis not present

## 2017-11-03 DIAGNOSIS — Z6836 Body mass index (BMI) 36.0-36.9, adult: Secondary | ICD-10-CM | POA: Diagnosis not present

## 2017-11-03 DIAGNOSIS — I2510 Atherosclerotic heart disease of native coronary artery without angina pectoris: Secondary | ICD-10-CM | POA: Diagnosis not present

## 2017-11-03 DIAGNOSIS — E669 Obesity, unspecified: Secondary | ICD-10-CM | POA: Diagnosis not present

## 2017-11-03 DIAGNOSIS — G8918 Other acute postprocedural pain: Secondary | ICD-10-CM | POA: Diagnosis not present

## 2017-11-03 DIAGNOSIS — Z7982 Long term (current) use of aspirin: Secondary | ICD-10-CM | POA: Diagnosis not present

## 2017-11-03 DIAGNOSIS — I10 Essential (primary) hypertension: Secondary | ICD-10-CM | POA: Diagnosis not present

## 2017-11-03 DIAGNOSIS — M25511 Pain in right shoulder: Secondary | ICD-10-CM | POA: Diagnosis not present

## 2017-11-03 LAB — POC SODIUM-POTASSIUM: Potassium, POC: 3.8 MMOL/L (ref 3.5–5.1)

## 2017-11-03 MED ORDER — LACTATED RINGERS IV
INTRAVENOUS | Status: DC
Start: 2017-11-03 — End: 2017-11-03

## 2017-11-03 MED ORDER — CEFAZOLIN 2 GRAM/20 ML IN STERILE WATER INTRAVENOUS SYRINGE
2 gram/0 mL | Freq: Once | INTRAVENOUS | Status: AC
Start: 2017-11-03 — End: 2017-11-03
  Administered 2017-11-03: 14:00:00 via INTRAVENOUS

## 2017-11-03 MED ORDER — DEXAMETHASONE SODIUM PHOSPHATE 4 MG/ML IJ SOLN
4 mg/mL | INTRAMUSCULAR | Status: DC | PRN
Start: 2017-11-03 — End: 2017-11-03
  Administered 2017-11-03: 14:00:00 via INTRAVENOUS

## 2017-11-03 MED ORDER — EPHEDRINE SULFATE 50 MG/ML INJECTION SOLUTION
50 mg/mL | INTRAMUSCULAR | Status: DC | PRN
Start: 2017-11-03 — End: 2017-11-03
  Administered 2017-11-03 (×3): via INTRAVENOUS

## 2017-11-03 MED ORDER — MIDAZOLAM 1 MG/ML IJ SOLN
1 mg/mL | Freq: Once | INTRAMUSCULAR | Status: DC | PRN
Start: 2017-11-03 — End: 2017-11-03

## 2017-11-03 MED ORDER — DIPHENHYDRAMINE HCL 50 MG/ML IJ SOLN
50 mg/mL | INTRAMUSCULAR | Status: DC | PRN
Start: 2017-11-03 — End: 2017-11-03

## 2017-11-03 MED ORDER — PROMETHAZINE 25 MG/ML INJECTION
25 mg/mL | INTRAMUSCULAR | Status: DC | PRN
Start: 2017-11-03 — End: 2017-11-03

## 2017-11-03 MED ORDER — LIDOCAINE HCL 1 % (10 MG/ML) IJ SOLN
10 mg/mL (1 %) | INTRAMUSCULAR | Status: DC | PRN
Start: 2017-11-03 — End: 2017-11-03

## 2017-11-03 MED ORDER — OXYCODONE 5 MG TAB
5 mg | Freq: Once | ORAL | Status: DC | PRN
Start: 2017-11-03 — End: 2017-11-03

## 2017-11-03 MED ORDER — METOPROLOL TARTRATE 25 MG TAB
25 mg | Freq: Once | ORAL | Status: AC
Start: 2017-11-03 — End: 2017-11-03
  Administered 2017-11-03: 13:00:00 via ORAL

## 2017-11-03 MED ORDER — LACTATED RINGERS IV
INTRAVENOUS | Status: DC
Start: 2017-11-03 — End: 2017-11-03
  Administered 2017-11-03: 13:00:00 via INTRAVENOUS

## 2017-11-03 MED ORDER — MIDAZOLAM 1 MG/ML IJ SOLN
1 mg/mL | Freq: Once | INTRAMUSCULAR | Status: AC
Start: 2017-11-03 — End: 2017-11-03
  Administered 2017-11-03: 13:00:00 via INTRAVENOUS

## 2017-11-03 MED ORDER — PROPOFOL 10 MG/ML IV EMUL
10 mg/mL | INTRAVENOUS | Status: DC | PRN
Start: 2017-11-03 — End: 2017-11-03
  Administered 2017-11-03: 14:00:00 via INTRAVENOUS

## 2017-11-03 MED ORDER — LIDOCAINE (PF) 20 MG/ML (2 %) IJ SOLN
20 mg/mL (2 %) | INTRAMUSCULAR | Status: DC | PRN
Start: 2017-11-03 — End: 2017-11-03
  Administered 2017-11-03: 14:00:00 via INTRAVENOUS

## 2017-11-03 MED ORDER — FENTANYL CITRATE (PF) 50 MCG/ML IJ SOLN
50 mcg/mL | Freq: Once | INTRAMUSCULAR | Status: AC
Start: 2017-11-03 — End: 2017-11-03
  Administered 2017-11-03: 13:00:00 via INTRAVENOUS

## 2017-11-03 MED ORDER — ONDANSETRON (PF) 4 MG/2 ML INJECTION
4 mg/2 mL | INTRAMUSCULAR | Status: DC | PRN
Start: 2017-11-03 — End: 2017-11-03
  Administered 2017-11-03: 15:00:00 via INTRAVENOUS

## 2017-11-03 MED ORDER — EPINEPHRINE 1 MG/ML IJ SOLN
1 mg/mL | INTRAMUSCULAR | Status: DC | PRN
Start: 2017-11-03 — End: 2017-11-03
  Administered 2017-11-03: 14:00:00

## 2017-11-03 MED ORDER — NALOXONE 0.4 MG/ML INJECTION
0.4 mg/mL | INTRAMUSCULAR | Status: DC | PRN
Start: 2017-11-03 — End: 2017-11-03

## 2017-11-03 MED ORDER — HYDROMORPHONE (PF) 2 MG/ML IJ SOLN
2 mg/mL | INTRAMUSCULAR | Status: DC | PRN
Start: 2017-11-03 — End: 2017-11-03

## 2017-11-03 MED ORDER — FLUMAZENIL 0.1 MG/ML IV SOLN
0.1 mg/mL | INTRAVENOUS | Status: DC | PRN
Start: 2017-11-03 — End: 2017-11-03

## 2017-11-03 MED ORDER — ROPIVACAINE (PF) 5 MG/ML (0.5 %) INJECTION
5 mg/mL (0. %) | INTRAMUSCULAR | Status: AC
Start: 2017-11-03 — End: 2017-11-03
  Administered 2017-11-03: 13:00:00 via PERINEURAL

## 2017-11-03 MED FILL — METOPROLOL TARTRATE 25 MG TAB: 25 mg | ORAL | Qty: 1

## 2017-11-03 MED FILL — EPHEDRINE SULFATE 50 MG/ML INTRAVENOUS SOLUTION: 50 mg/mL | INTRAVENOUS | Qty: 17.5

## 2017-11-03 MED FILL — CEFAZOLIN 2 GRAM/20 ML IN STERILE WATER INTRAVENOUS SYRINGE: 2 gram/0 mL | INTRAVENOUS | Qty: 20

## 2017-11-03 MED FILL — FENTANYL CITRATE (PF) 50 MCG/ML IJ SOLN: 50 mcg/mL | INTRAMUSCULAR | Qty: 2

## 2017-11-03 MED FILL — XYLOCAINE-MPF 20 MG/ML (2 %) INJECTION SOLUTION: 20 mg/mL (2 %) | INTRAMUSCULAR | Qty: 60

## 2017-11-03 MED FILL — DEXAMETHASONE SODIUM PHOSPHATE 4 MG/ML IJ SOLN: 4 mg/mL | INTRAMUSCULAR | Qty: 4

## 2017-11-03 MED FILL — ONDANSETRON (PF) 4 MG/2 ML INJECTION: 4 mg/2 mL | INTRAMUSCULAR | Qty: 4

## 2017-11-03 MED FILL — NAROPIN (PF) 5 MG/ML (0.5 %) INJECTION SOLUTION: 5 mg/mL (0. %) | INTRAMUSCULAR | Qty: 26.25

## 2017-11-03 MED FILL — PROPOFOL 10 MG/ML IV EMUL: 10 mg/mL | INTRAVENOUS | Qty: 200

## 2017-11-03 MED FILL — MIDAZOLAM 1 MG/ML IJ SOLN: 1 mg/mL | INTRAMUSCULAR | Qty: 2

## 2017-11-03 NOTE — Op Note (Signed)
[name], Brian Buckley  MR#: [id number]  DOB: 11-17-1945  ACCOUNT #: [id number]   DATE OF SERVICE: 11/03/2017    PREOPERATIVE DIAGNOSIS:  Possible right rotator cuff tear.    POSTOPERATIVE DIAGNOSES:  1.  Tear of the right rotator cuff.  2.  Degeneration of the labrum.    PROCEDURES PERFORMED:  1.  Arthroscopic rotator cuff repair using the [employer] footprint system.  2.  Debridement of degenerative labral tear.    SURGEON:  Tyrone SageJohn Khaleah Duer, MD    ASSISTANT:  None.    ANESTHESIA:  General.    ESTIMATED BLOOD LOSS:  Minimal.    COMPLICATIONS:  None.    IMPLANTS:  None.    SPECIMENS REMOVED:  None.    DESCRIPTION OF PROCEDURE:  After an adequate level of general anesthesia was obtained, the patient had good range of motion of the right shoulder.  The joint was distended posteriorly.  Arthroscope introduced.  The patient had some mild degenerative changes.  Overall, the biceps looked good and was probed and noted to be stable, has some degeneration of the labrum anteriorly, but it did not have a detached labrum.  Debridement was performed.  Arthroscope was placed in the subacromial space.  A lateral portal was made.  The patient had some hooking of the acromion.  A decompression was performed.  The patient had a small rotator cuff crescent tear which was repairable.  A Smith and Nephew footprint system was used.  The tear was mobilized.  The sutures were spaced accordingly and a suture anchor was placed snugly of the bone and then the sutures pulled and device tightened.  It was probed and was stable as was put through a range of motion.  The portals closed with Monocryl subcuticular stitches placed.  Steri-Strips and sterile dressings applied.  Arm was placed in a shoulder immobilizer, tolerated the procedure well.      [name], MD       JRV / HN  D: 11/03/2017 10:05     T: 11/03/2017 10:13  JOB #: [id number]  CC: Tyrone SageJOHN Tyler Cubit MD

## 2017-11-03 NOTE — Op Note (Signed)
[place] [facility]  OPERATIVE REPORT    Name:Buckley, Brian [name]  MR#: [id number]  DOB: 04/09/1946  ACCOUNT #: [id number]   DATE OF SERVICE: 11/03/2017    PREOPERATIVE DIAGNOSIS:  Possible right rotator cuff tear.    POSTOPERATIVE DIAGNOSES:  1.  Tear of the right rotator cuff.  2.  Degeneration of the labrum.    PROCEDURES PERFORMED:  1.  Arthroscopic rotator cuff repair using the Smith and Nephew footprint system.  2.  Debridement of degenerative labral tear.    SURGEON:  Johnell Bas, MD    ASSISTANT:  None.    ANESTHESIA:  General.    ESTIMATED BLOOD LOSS:  Minimal.    COMPLICATIONS:  None.    IMPLANTS:  None.    SPECIMENS REMOVED:  None.    DESCRIPTION OF PROCEDURE:  After an adequate level of general anesthesia was obtained, the patient had good range of motion of the right shoulder.  The joint was distended posteriorly.  Arthroscope introduced.  The patient had some mild degenerative changes.  Overall, the biceps looked good and was probed and noted to be stable, has some degeneration of the labrum anteriorly, but it did not have a detached labrum.  Debridement was performed.  Arthroscope was placed in the subacromial space.  A lateral portal was made.  The patient had some hooking of the acromion.  A decompression was performed.  The patient had a small rotator cuff crescent tear which was repairable.  A Smith and Nephew footprint system was used.  The tear was mobilized.  The sutures were spaced accordingly and a suture anchor was placed snugly of the bone and then the sutures pulled and device tightened.  It was probed and was stable as was put through a range of motion.  The portals closed with Monocryl subcuticular stitches placed.  Steri-Strips and sterile dressings applied.  Arm was placed in a shoulder immobilizer, tolerated the procedure well.      Sabrea Sankey R. Nicklous Aburto, MD       Brian Buckley  D: 11/03/2017 10:05     T: 11/03/2017 10:13  JOB #: [id number]  CC: Ivon Roedel MD

## 2017-11-03 NOTE — Brief Op Note (Signed)
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.41]  Postoperative Diagnosis: Incomplete rotator cuff tear or rupture of right shoulder Subacromial impingement of right shoulder    Procedure(s):  RIGHT SHOULDER ARTHROSCOPY SUBACROMIAL DECOMPRESSION ROTATOR CUFF REPAIR  Surgeon(s) and Role:     Brock Ra* Aylana Hirschfeld R, MD - Primary         Surgical Assistant:       Surgical Staff:  Circ-1: Oneal Groutallas, Jeanette M., RN  Scrub Tech-1: Cristy FolksJenkins, Jennifer L; Roth, Kyle J  Event Time In Time Out   Incision Start [phone]    Incision Close       Anesthesia: General   Estimated Blood Loss:     Specimens: * No specimens in log *   Findings:      Complications:       Implants:   Implant Name Type Inv. Item Serial No. Manufacturer Lot No. LRB No. Used Action   ANCHOR FOOTPRINT SUT 4.5MM --  - [id number]  ANCHOR FOOTPRINT SUT 4.5MM --   [facility] [id number] Right 1 Implanted

## 2017-11-03 NOTE — Progress Notes (Signed)
Chaplain's Pre-op visit requested by patient. Conveyed care and concern for patient and family. Offered prayer as requested for patient, family, and staff.    Adrian Duckett, MDiv, BS  Board Certified Chaplain

## 2017-11-03 NOTE — Anesthesia Procedure Notes (Signed)
Peripheral Block    Start time: 11/03/2017 8:21 AM  End time: 11/03/2017 8:24 AM  Performed by: Kary Colaizzi Allen, MD  Authorized by: Keaghan Bowens Allen, MD       Pre-procedure:   Indications: at surgeon's request and post-op pain management    Preanesthetic Checklist: patient identified, risks and benefits discussed, site marked, timeout performed, anesthesia consent given and patient being monitored    Timeout Time: 08:20          Block Type:   Block Type:  Interscalene  Laterality:  Right  Monitoring:  Standard ASA monitoring, continuous pulse ox, frequent vital sign checks, heart rate, oxygen and responsive to questions  Injection Technique:  Single shot  Procedures: ultrasound guided and nerve stimulator    Patient Position: supine (30 degree upright)  Prep: chlorhexidine    Location:  Interscalene  Needle Type:  Stimuplex  Needle Gauge:  21 G  Needle Localization:  Nerve stimulator and ultrasound guidance  Motor Response: minimal motor response >0.4 mA    Motor Response comment:  Motor twitch extinguished between 0.2-0.5 mA    Assessment:  Number of attempts:  1  Injection Assessment:  Incremental injection every 5 mL, negative aspiration for CSF, no paresthesia, ultrasound image on chart, local visualized surrounding nerve on ultrasound, negative aspiration for blood and no intravascular symptoms  Patient tolerance:  Patient tolerated the procedure well with no immediate complications

## 2017-11-03 NOTE — H&P (Signed)
Outpatient Surgery History and Physical:  Brian Buckley was seen and examined.    CHIEF COMPLAINT:    r shoulder .     PE:     Visit Vitals  BP (!) 188/101 (BP 1 Location: Left arm, BP Patient Position: Supine)   Pulse 65   Temp 98 ??F (36.7 ??C)   Resp 18   Wt 115.7 kg (255 lb)   SpO2 95%   BMI 36.59 kg/m??       Heart:   Regular rhythm      Lungs:  Are clear      Past Medical History:    Patient Active Problem List    Diagnosis   ??? Prosthetic hip implant failure (HCC)   ??? S/P prosthetic total arthroplasty of the hip       Surgical History:   Past Surgical History:   Procedure Laterality Date   ??? CARDIAC SURG PROCEDURE UNLIST  2016    cabg   ??? HX ORTHOPAEDIC Right      big toe fusion   ??? HX ORTHOPAEDIC Left     shoulder surgery   ??? HX OTHER SURGICAL  1965    pilonidal cyst   ??? TOTAL HIP ARTHROPLASTY  1995, 2009    right, left   ??? TOTAL HIP ARTHROPLASTY Left 11/11    revision       Social History: Patient  reports that  has never smoked. he has never used smokeless tobacco. He reports that he does not drink alcohol or use drugs.    Family History:   Family History   Problem Relation Age of Onset   ??? Asthma Mother    ??? Cancer Father         brain   ??? Heart Surgery Sister    ??? Diabetes Sister    ??? No Known Problems Sister    ??? Malignant Hyperthermia Neg Hx    ??? Pseudocholinesterase Deficiency Neg Hx    ??? Delayed Awakening Neg Hx    ??? Post-op Nausea/Vomiting Neg Hx    ??? Emergence Delirium Neg Hx    ??? Post-op Cognitive Dysfunction Neg Hx    ??? Other Neg Hx        Allergies: Reviewed per EMR  No Known Allergies    Medications:    No current facility-administered medications on file prior to encounter.      Current Outpatient Medications on File Prior to Encounter   Medication Sig   ??? aspirin delayed-release 81 mg tablet Take 1 Tab by mouth two (2) times a day. (Patient taking differently: Take 81 mg by mouth daily.)   ??? losartan-hydrochlorothiazide (HYZAAR) 100-25 mg per tablet Take 1 Tab by  mouth daily (with lunch).   ??? metoprolol-XL (TOPROL-XL) 100 mg XL tablet Take 12.5 mg by mouth two (2) times a day. Patient instructed to take morning of surgery per anesthesia guidelines           The surgery is planned for the  shoulder.        History and physical has been reviewed. The patient has been examined. There have been no significant clinical changes since the completion of the originally dated History and Physical.  Patient identified by surgeon; surgical site was confirmed by patient and surgeon.      The patient is here today for outpatient surgery. I have examined the patient, no changes are noted in the patient's medical status. Necessity for the procedure/care is still present and  the history and physical above is current.  See the office notes for the full long term history of the problem.  Please see the recent office notes for the musculoskeletal examination.    Signed By: Brock RaJohn R Laurel Smeltz, MD     November 03, 2017 7:00 AM

## 2017-11-03 NOTE — Anesthesia Pre-Procedure Evaluation (Signed)
Anesthetic History     PONV          Review of Systems / Medical History  Patient summary reviewed and pertinent labs reviewed    Pulmonary        Sleep apnea: No treatment           Neuro/Psych   Within defined limits           Cardiovascular    Hypertension: well controlled          CAD, CABG (s/p CABG 2016) and hyperlipidemia    Exercise tolerance: >4 METS     GI/Hepatic/Renal  Within defined limits              Endo/Other        Obesity and arthritis     Other Findings              Physical Exam    Airway  Mallampati: II  TM Distance: > 6 cm  Neck ROM: normal range of motion   Mouth opening: Normal     Cardiovascular    Rhythm: regular  Rate: normal         Dental    Dentition: Lower partial plate and Upper partial plate     Pulmonary  Breath sounds clear to auscultation               Abdominal         Other Findings            Anesthetic Plan    ASA: 3  Anesthesia type: general      Post-op pain plan if not by surgeon: peripheral nerve block single      Anesthetic plan and risks discussed with: Patient

## 2017-11-03 NOTE — Anesthesia Post-Procedure Evaluation (Signed)
Procedure(s):  RIGHT SHOULDER ARTHROSCOPY SUBACROMIAL DECOMPRESSION ROTATOR CUFF REPAIR.    Anesthesia Post Evaluation      Multimodal analgesia: multimodal analgesia used between 6 hours prior to anesthesia start to PACU discharge  Patient location during evaluation: PACU  Patient participation: complete - patient participated  Level of consciousness: awake  Pain management: adequate  Airway patency: patent  Anesthetic complications: no  Cardiovascular status: acceptable and hemodynamically stable  Respiratory status: acceptable  Hydration status: acceptable  Comments: Acceptable for discharge from PACU.        Visit Vitals  BP 165/86   Pulse 61   Temp 36.6 ??C (97.8 ??F)   Resp 14   Wt 115.7 kg (255 lb)   SpO2 93%   BMI 36.59 kg/m??

## 2017-11-03 NOTE — Anesthesia Procedure Notes (Signed)
Peripheral Block    Start time: 11/03/2017 8:21 AM  End time: 11/03/2017 8:24 AM  Performed by: Dolly RiasFreedman, Loreta Blouch Allen, MD  Authorized by: Dolly RiasFreedman, [place] Doig Allen, MD       Pre-procedure:   Indications: at surgeon's request and post-op pain management    Preanesthetic Checklist: patient identified, risks and benefits discussed, site marked, timeout performed, anesthesia consent given and patient being monitored    Timeout Time: 08:20          Block Type:   Block Type:  Interscalene  Laterality:  Right  Monitoring:  Standard ASA monitoring, continuous pulse ox, frequent vital sign checks, heart rate, oxygen and responsive to questions  Injection Technique:  Single shot  Procedures: ultrasound guided and nerve stimulator    Patient Position: supine (30 degree upright)  Prep: chlorhexidine    Location:  Interscalene  Needle Type:  Stimuplex  Needle Gauge:  21 G  Needle Localization:  Nerve stimulator and ultrasound guidance  Motor Response: minimal motor response >0.4 mA    Motor Response comment:  Motor twitch extinguished between 0.2-0.5 mA    Assessment:  Number of attempts:  1  Injection Assessment:  Incremental injection every 5 mL, negative aspiration for CSF, no paresthesia, ultrasound image on chart, local visualized surrounding nerve on ultrasound, negative aspiration for blood and no intravascular symptoms  Patient tolerance:  Patient tolerated the procedure well with no immediate complications

## 2017-11-06 NOTE — Addendum Note (Signed)
Addendum  created 11/06/17 0815 by Marcille BlancoVisser, Shakeyla Giebler M, CRNA    Intraprocedure Flowsheets edited

## 2017-11-21 DIAGNOSIS — M25511 Pain in right shoulder: Secondary | ICD-10-CM | POA: Diagnosis not present

## 2017-11-21 DIAGNOSIS — M6281 Muscle weakness (generalized): Secondary | ICD-10-CM | POA: Diagnosis not present

## 2017-11-24 DIAGNOSIS — M25511 Pain in right shoulder: Secondary | ICD-10-CM | POA: Diagnosis not present

## 2017-11-24 DIAGNOSIS — M6281 Muscle weakness (generalized): Secondary | ICD-10-CM | POA: Diagnosis not present

## 2017-11-28 DIAGNOSIS — M6281 Muscle weakness (generalized): Secondary | ICD-10-CM | POA: Diagnosis not present

## 2017-11-28 DIAGNOSIS — M25511 Pain in right shoulder: Secondary | ICD-10-CM | POA: Diagnosis not present

## 2017-11-30 DIAGNOSIS — M6281 Muscle weakness (generalized): Secondary | ICD-10-CM | POA: Diagnosis not present

## 2017-11-30 DIAGNOSIS — M25511 Pain in right shoulder: Secondary | ICD-10-CM | POA: Diagnosis not present

## 2017-12-05 DIAGNOSIS — M25511 Pain in right shoulder: Secondary | ICD-10-CM | POA: Diagnosis not present

## 2017-12-05 DIAGNOSIS — M6281 Muscle weakness (generalized): Secondary | ICD-10-CM | POA: Diagnosis not present

## 2017-12-07 DIAGNOSIS — M25511 Pain in right shoulder: Secondary | ICD-10-CM | POA: Diagnosis not present

## 2017-12-07 DIAGNOSIS — M6281 Muscle weakness (generalized): Secondary | ICD-10-CM | POA: Diagnosis not present

## 2017-12-12 DIAGNOSIS — M6281 Muscle weakness (generalized): Secondary | ICD-10-CM | POA: Diagnosis not present

## 2017-12-12 DIAGNOSIS — M25511 Pain in right shoulder: Secondary | ICD-10-CM | POA: Diagnosis not present

## 2017-12-14 DIAGNOSIS — M6281 Muscle weakness (generalized): Secondary | ICD-10-CM | POA: Diagnosis not present

## 2017-12-14 DIAGNOSIS — M25511 Pain in right shoulder: Secondary | ICD-10-CM | POA: Diagnosis not present

## 2017-12-19 DIAGNOSIS — M25511 Pain in right shoulder: Secondary | ICD-10-CM | POA: Diagnosis not present

## 2017-12-19 DIAGNOSIS — M6281 Muscle weakness (generalized): Secondary | ICD-10-CM | POA: Diagnosis not present

## 2017-12-21 DIAGNOSIS — M25511 Pain in right shoulder: Secondary | ICD-10-CM | POA: Diagnosis not present

## 2017-12-21 DIAGNOSIS — M6281 Muscle weakness (generalized): Secondary | ICD-10-CM | POA: Diagnosis not present

## 2017-12-22 DIAGNOSIS — Z9889 Other specified postprocedural states: Secondary | ICD-10-CM | POA: Diagnosis not present

## 2017-12-22 DIAGNOSIS — M25511 Pain in right shoulder: Secondary | ICD-10-CM | POA: Diagnosis not present

## 2017-12-22 DIAGNOSIS — R5383 Other fatigue: Secondary | ICD-10-CM | POA: Diagnosis not present

## 2017-12-22 DIAGNOSIS — E559 Vitamin D deficiency, unspecified: Secondary | ICD-10-CM | POA: Diagnosis not present

## 2017-12-22 DIAGNOSIS — E785 Hyperlipidemia, unspecified: Secondary | ICD-10-CM | POA: Diagnosis not present

## 2017-12-22 DIAGNOSIS — I10 Essential (primary) hypertension: Secondary | ICD-10-CM | POA: Diagnosis not present

## 2017-12-22 DIAGNOSIS — G8929 Other chronic pain: Secondary | ICD-10-CM | POA: Diagnosis not present

## 2017-12-22 DIAGNOSIS — R739 Hyperglycemia, unspecified: Secondary | ICD-10-CM | POA: Diagnosis not present

## 2017-12-22 DIAGNOSIS — I25810 Atherosclerosis of coronary artery bypass graft(s) without angina pectoris: Secondary | ICD-10-CM | POA: Diagnosis not present

## 2017-12-26 DIAGNOSIS — M25511 Pain in right shoulder: Secondary | ICD-10-CM | POA: Diagnosis not present

## 2017-12-26 DIAGNOSIS — M6281 Muscle weakness (generalized): Secondary | ICD-10-CM | POA: Diagnosis not present

## 2017-12-28 DIAGNOSIS — M25511 Pain in right shoulder: Secondary | ICD-10-CM | POA: Diagnosis not present

## 2017-12-28 DIAGNOSIS — M6281 Muscle weakness (generalized): Secondary | ICD-10-CM | POA: Diagnosis not present

## 2018-01-02 DIAGNOSIS — M25511 Pain in right shoulder: Secondary | ICD-10-CM | POA: Diagnosis not present

## 2018-01-02 DIAGNOSIS — M6281 Muscle weakness (generalized): Secondary | ICD-10-CM | POA: Diagnosis not present

## 2018-01-04 DIAGNOSIS — M25511 Pain in right shoulder: Secondary | ICD-10-CM | POA: Diagnosis not present

## 2018-01-04 DIAGNOSIS — M6281 Muscle weakness (generalized): Secondary | ICD-10-CM | POA: Diagnosis not present

## 2018-01-09 DIAGNOSIS — M25511 Pain in right shoulder: Secondary | ICD-10-CM | POA: Diagnosis not present

## 2018-01-09 DIAGNOSIS — M6281 Muscle weakness (generalized): Secondary | ICD-10-CM | POA: Diagnosis not present

## 2018-01-11 DIAGNOSIS — M6281 Muscle weakness (generalized): Secondary | ICD-10-CM | POA: Diagnosis not present

## 2018-01-11 DIAGNOSIS — M25511 Pain in right shoulder: Secondary | ICD-10-CM | POA: Diagnosis not present

## 2018-01-16 DIAGNOSIS — M6281 Muscle weakness (generalized): Secondary | ICD-10-CM | POA: Diagnosis not present

## 2018-01-16 DIAGNOSIS — M25511 Pain in right shoulder: Secondary | ICD-10-CM | POA: Diagnosis not present

## 2018-01-19 DIAGNOSIS — M25511 Pain in right shoulder: Secondary | ICD-10-CM | POA: Diagnosis not present

## 2018-01-19 DIAGNOSIS — M6281 Muscle weakness (generalized): Secondary | ICD-10-CM | POA: Diagnosis not present

## 2018-01-23 DIAGNOSIS — M25511 Pain in right shoulder: Secondary | ICD-10-CM | POA: Diagnosis not present

## 2018-01-23 DIAGNOSIS — M6281 Muscle weakness (generalized): Secondary | ICD-10-CM | POA: Diagnosis not present

## 2018-01-26 DIAGNOSIS — M25511 Pain in right shoulder: Secondary | ICD-10-CM | POA: Diagnosis not present

## 2018-01-26 DIAGNOSIS — M6281 Muscle weakness (generalized): Secondary | ICD-10-CM | POA: Diagnosis not present

## 2018-01-30 DIAGNOSIS — M25511 Pain in right shoulder: Secondary | ICD-10-CM | POA: Diagnosis not present

## 2018-01-30 DIAGNOSIS — M6281 Muscle weakness (generalized): Secondary | ICD-10-CM | POA: Diagnosis not present

## 2018-02-02 DIAGNOSIS — M25511 Pain in right shoulder: Secondary | ICD-10-CM | POA: Diagnosis not present

## 2018-02-02 DIAGNOSIS — M6281 Muscle weakness (generalized): Secondary | ICD-10-CM | POA: Diagnosis not present

## 2018-02-06 DIAGNOSIS — M25511 Pain in right shoulder: Secondary | ICD-10-CM | POA: Diagnosis not present

## 2018-02-06 DIAGNOSIS — M6281 Muscle weakness (generalized): Secondary | ICD-10-CM | POA: Diagnosis not present

## 2018-02-09 DIAGNOSIS — M25511 Pain in right shoulder: Secondary | ICD-10-CM | POA: Diagnosis not present

## 2018-02-09 DIAGNOSIS — M6281 Muscle weakness (generalized): Secondary | ICD-10-CM | POA: Diagnosis not present

## 2018-03-23 DIAGNOSIS — E559 Vitamin D deficiency, unspecified: Secondary | ICD-10-CM | POA: Diagnosis not present

## 2018-03-23 DIAGNOSIS — R5383 Other fatigue: Secondary | ICD-10-CM | POA: Diagnosis not present

## 2018-03-23 DIAGNOSIS — M545 Low back pain: Secondary | ICD-10-CM | POA: Diagnosis not present

## 2018-03-23 DIAGNOSIS — R739 Hyperglycemia, unspecified: Secondary | ICD-10-CM | POA: Diagnosis not present

## 2018-03-23 DIAGNOSIS — Z96642 Presence of left artificial hip joint: Secondary | ICD-10-CM | POA: Diagnosis not present

## 2018-03-23 DIAGNOSIS — E785 Hyperlipidemia, unspecified: Secondary | ICD-10-CM | POA: Diagnosis not present

## 2018-03-23 DIAGNOSIS — Z471 Aftercare following joint replacement surgery: Secondary | ICD-10-CM | POA: Diagnosis not present

## 2018-03-23 DIAGNOSIS — Z96641 Presence of right artificial hip joint: Secondary | ICD-10-CM | POA: Diagnosis not present

## 2018-03-23 DIAGNOSIS — I25810 Atherosclerosis of coronary artery bypass graft(s) without angina pectoris: Secondary | ICD-10-CM | POA: Diagnosis not present

## 2018-03-26 DIAGNOSIS — E785 Hyperlipidemia, unspecified: Secondary | ICD-10-CM | POA: Diagnosis not present

## 2018-03-26 DIAGNOSIS — I10 Essential (primary) hypertension: Secondary | ICD-10-CM | POA: Diagnosis not present

## 2018-03-30 DIAGNOSIS — E785 Hyperlipidemia, unspecified: Secondary | ICD-10-CM | POA: Diagnosis not present

## 2018-03-30 DIAGNOSIS — I10 Essential (primary) hypertension: Secondary | ICD-10-CM | POA: Diagnosis not present

## 2018-03-30 DIAGNOSIS — I25810 Atherosclerosis of coronary artery bypass graft(s) without angina pectoris: Secondary | ICD-10-CM | POA: Diagnosis not present

## 2018-04-09 DIAGNOSIS — L570 Actinic keratosis: Secondary | ICD-10-CM | POA: Diagnosis not present

## 2018-04-09 DIAGNOSIS — L920 Granuloma annulare: Secondary | ICD-10-CM | POA: Diagnosis not present

## 2018-04-09 DIAGNOSIS — D485 Neoplasm of uncertain behavior of skin: Secondary | ICD-10-CM | POA: Diagnosis not present

## 2018-04-09 DIAGNOSIS — D225 Melanocytic nevi of trunk: Secondary | ICD-10-CM | POA: Diagnosis not present

## 2018-04-09 DIAGNOSIS — L578 Other skin changes due to chronic exposure to nonionizing radiation: Secondary | ICD-10-CM | POA: Diagnosis not present

## 2018-04-09 DIAGNOSIS — C44229 Squamous cell carcinoma of skin of left ear and external auricular canal: Secondary | ICD-10-CM | POA: Diagnosis not present

## 2018-04-25 DIAGNOSIS — D485 Neoplasm of uncertain behavior of skin: Secondary | ICD-10-CM | POA: Diagnosis not present

## 2018-04-25 DIAGNOSIS — L570 Actinic keratosis: Secondary | ICD-10-CM | POA: Diagnosis not present

## 2018-04-25 DIAGNOSIS — L920 Granuloma annulare: Secondary | ICD-10-CM | POA: Diagnosis not present

## 2018-06-29 DIAGNOSIS — G4730 Sleep apnea, unspecified: Secondary | ICD-10-CM | POA: Diagnosis not present

## 2018-06-29 DIAGNOSIS — I2510 Atherosclerotic heart disease of native coronary artery without angina pectoris: Secondary | ICD-10-CM | POA: Diagnosis not present

## 2018-06-29 DIAGNOSIS — I10 Essential (primary) hypertension: Secondary | ICD-10-CM | POA: Diagnosis not present

## 2018-06-29 DIAGNOSIS — Z79899 Other long term (current) drug therapy: Secondary | ICD-10-CM | POA: Diagnosis not present

## 2018-07-19 DIAGNOSIS — M7550 Bursitis of unspecified shoulder: Secondary | ICD-10-CM | POA: Diagnosis not present

## 2018-07-19 DIAGNOSIS — M25519 Pain in unspecified shoulder: Secondary | ICD-10-CM | POA: Diagnosis not present

## 2018-07-20 DIAGNOSIS — M25511 Pain in right shoulder: Secondary | ICD-10-CM | POA: Diagnosis not present

## 2018-08-27 DIAGNOSIS — I2510 Atherosclerotic heart disease of native coronary artery without angina pectoris: Secondary | ICD-10-CM | POA: Diagnosis not present

## 2018-08-27 DIAGNOSIS — N529 Male erectile dysfunction, unspecified: Secondary | ICD-10-CM | POA: Diagnosis not present

## 2018-08-27 DIAGNOSIS — E785 Hyperlipidemia, unspecified: Secondary | ICD-10-CM | POA: Diagnosis not present

## 2018-08-27 DIAGNOSIS — R609 Edema, unspecified: Secondary | ICD-10-CM | POA: Diagnosis not present

## 2018-08-27 DIAGNOSIS — I10 Essential (primary) hypertension: Secondary | ICD-10-CM | POA: Diagnosis not present

## 2018-08-27 DIAGNOSIS — Z951 Presence of aortocoronary bypass graft: Secondary | ICD-10-CM | POA: Diagnosis not present

## 2018-08-27 DIAGNOSIS — G4733 Obstructive sleep apnea (adult) (pediatric): Secondary | ICD-10-CM | POA: Diagnosis not present

## 2018-10-01 DIAGNOSIS — Z85828 Personal history of other malignant neoplasm of skin: Secondary | ICD-10-CM | POA: Diagnosis not present

## 2018-10-01 DIAGNOSIS — I2510 Atherosclerotic heart disease of native coronary artery without angina pectoris: Secondary | ICD-10-CM | POA: Diagnosis not present

## 2018-10-01 DIAGNOSIS — I10 Essential (primary) hypertension: Secondary | ICD-10-CM | POA: Diagnosis not present

## 2018-10-01 DIAGNOSIS — Z23 Encounter for immunization: Secondary | ICD-10-CM | POA: Diagnosis not present

## 2018-10-01 DIAGNOSIS — Z79899 Other long term (current) drug therapy: Secondary | ICD-10-CM | POA: Diagnosis not present

## 2018-10-19 DIAGNOSIS — M75101 Unspecified rotator cuff tear or rupture of right shoulder, not specified as traumatic: Secondary | ICD-10-CM | POA: Diagnosis not present
